# Patient Record
Sex: Female | Born: 2000 | Race: White | Hispanic: No | Marital: Single | Smoking: Former smoker
Health system: Southern US, Community
[De-identification: ages and names within clinical notes are randomized; demographics above are authoritative.]

## PROBLEM LIST (undated history)

## (undated) ENCOUNTER — Inpatient Hospital Stay (HOSPITAL_COMMUNITY): Payer: Self-pay

## (undated) DIAGNOSIS — G40909 Epilepsy, unspecified, not intractable, without status epilepticus: Secondary | ICD-10-CM

## (undated) DIAGNOSIS — N83209 Unspecified ovarian cyst, unspecified side: Secondary | ICD-10-CM

## (undated) DIAGNOSIS — R569 Unspecified convulsions: Secondary | ICD-10-CM

## (undated) DIAGNOSIS — J020 Streptococcal pharyngitis: Secondary | ICD-10-CM

## (undated) HISTORY — PX: WISDOM TOOTH EXTRACTION: SHX21

## (undated) HISTORY — DX: Epilepsy, unspecified, not intractable, without status epilepticus: G40.909

## (undated) HISTORY — DX: Unspecified convulsions: R56.9

---
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
## 2018-03-10 ENCOUNTER — Ambulatory Visit: Payer: Medicaid Other | Admitting: Neurology

## 2018-03-10 ENCOUNTER — Telehealth: Payer: Self-pay | Admitting: *Deleted

## 2018-03-10 NOTE — Telephone Encounter (Signed)
Pt no showed her f/u appt on 03/10/2018 @ 11:00 AM.

## 2018-03-11 ENCOUNTER — Encounter: Payer: Self-pay | Admitting: Neurology

## 2019-09-07 IMAGING — US US RENAL
1 series · 15 of 25 positions shown · non-contrast
Comparison: None.

CLINICAL DATA: 16-year-old female with sudden onset left flank pain
in the third trimester of pregnancy.

EXAM:
RENAL / URINARY TRACT ULTRASOUND COMPLETE

[Series 1: us renal · 15 of 38 slices shown]
[im 1/38]
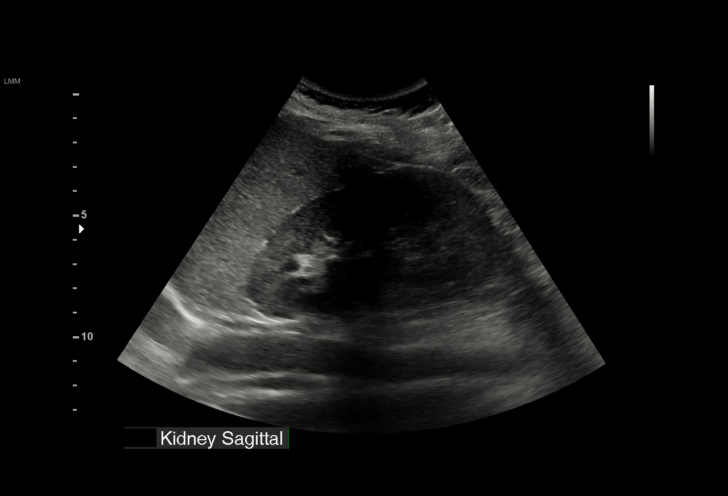
[im 4/38]
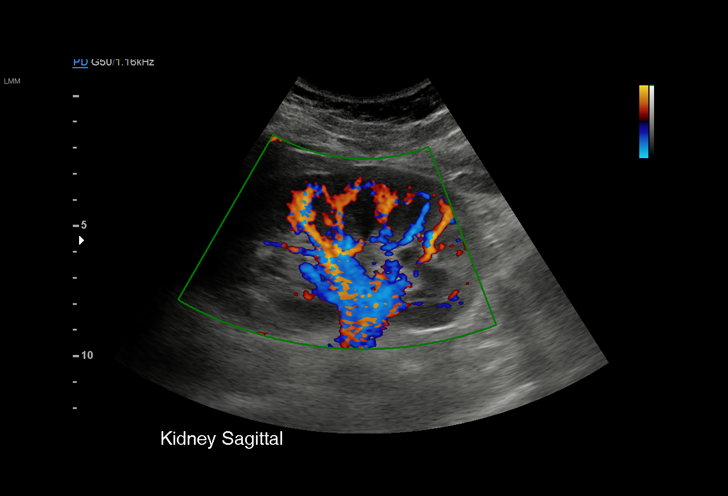
[im 7/38]
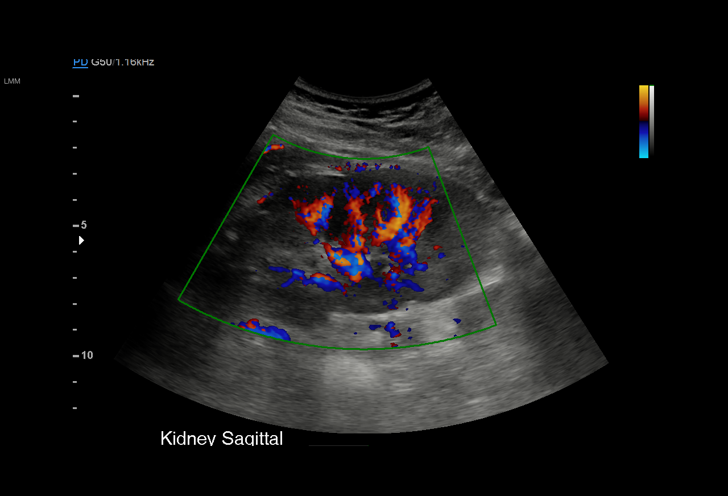
[im 8/38]
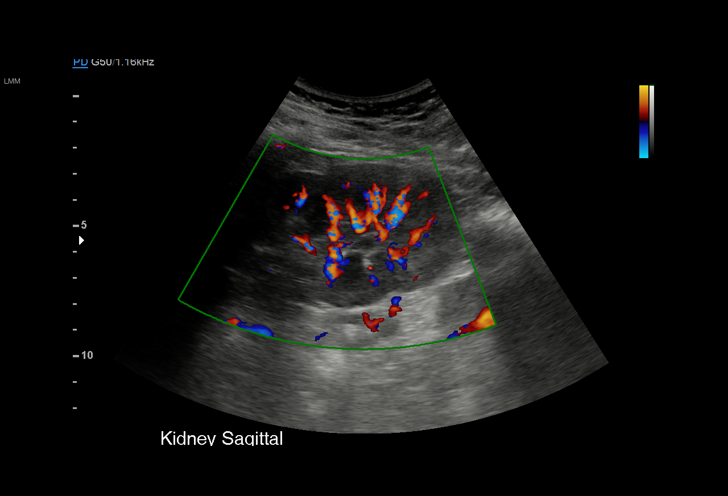
[im 11/38]
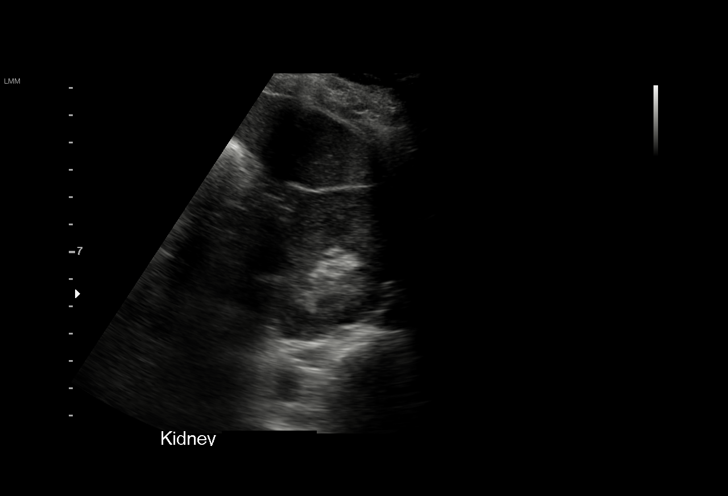
[im 14/38]
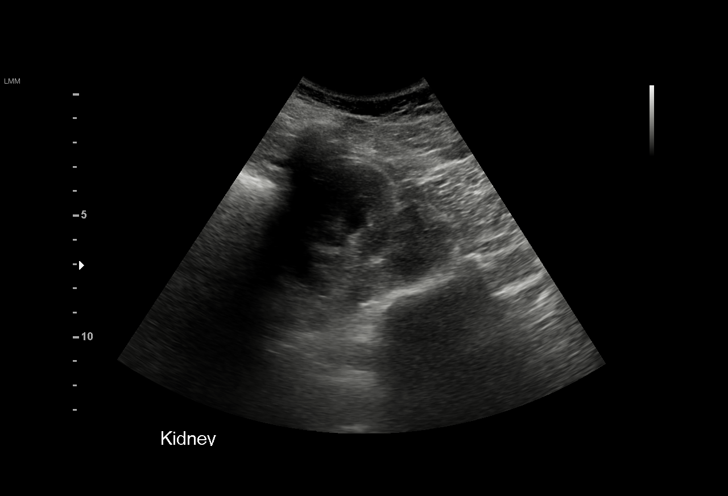
[im 16/38]
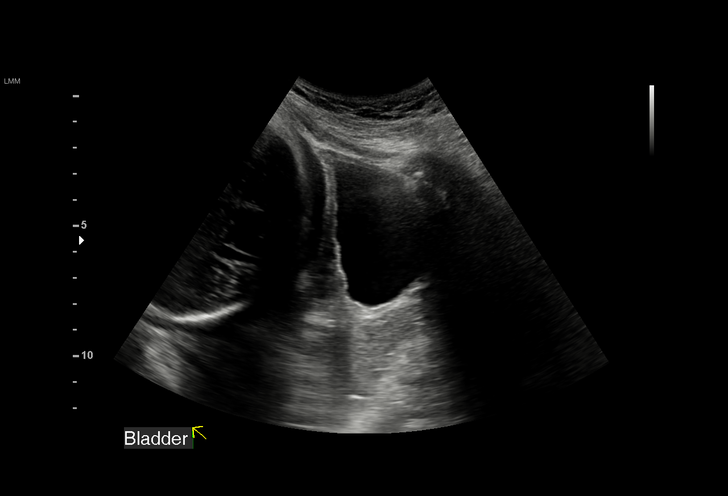
[im 19/38]
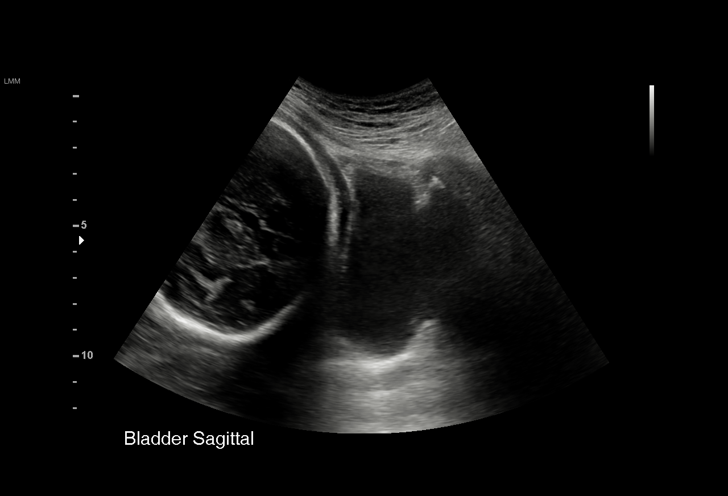
[im 22/38]
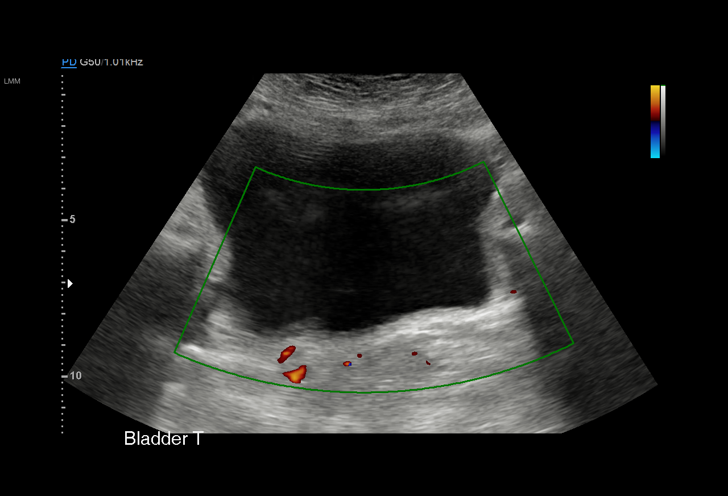
[im 24/38]
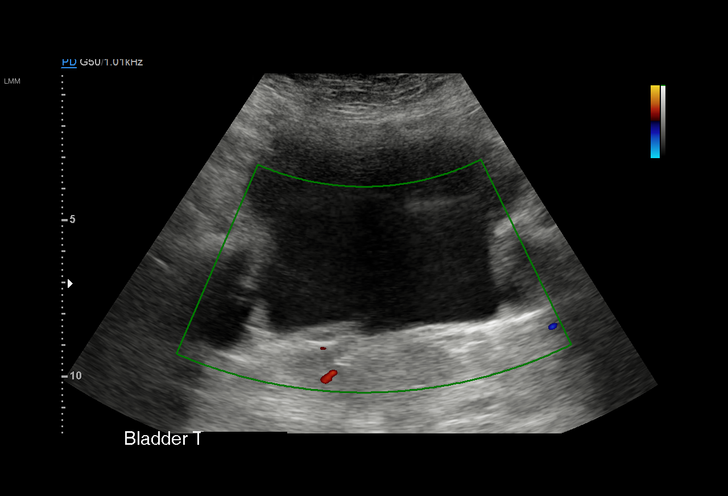
[im 27/38]
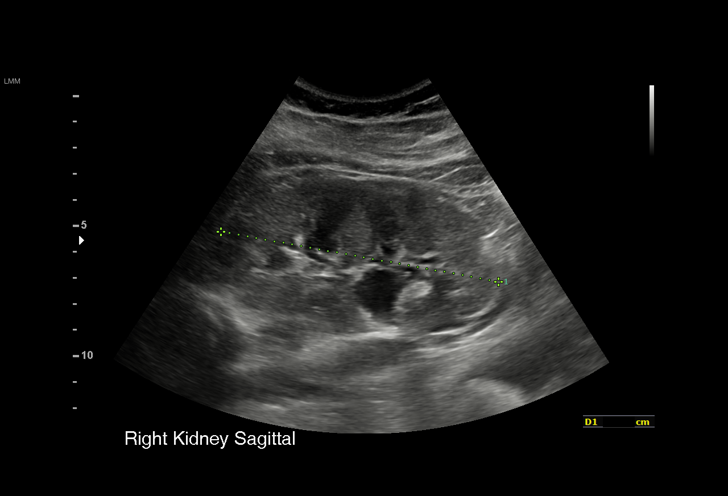
[im 30/38]
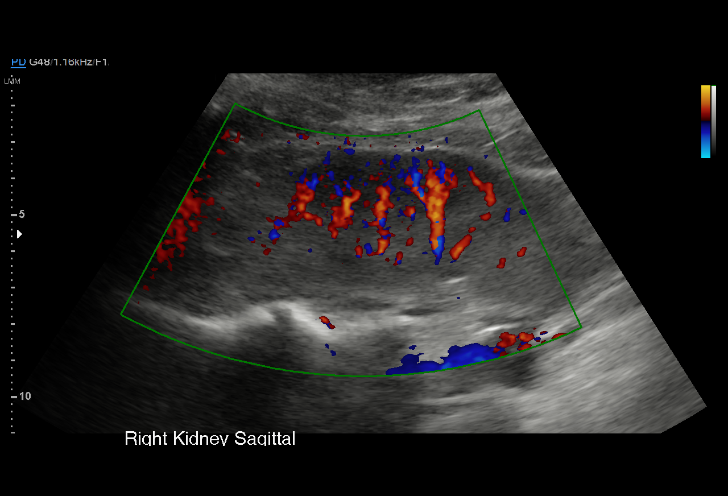
[im 31/38]
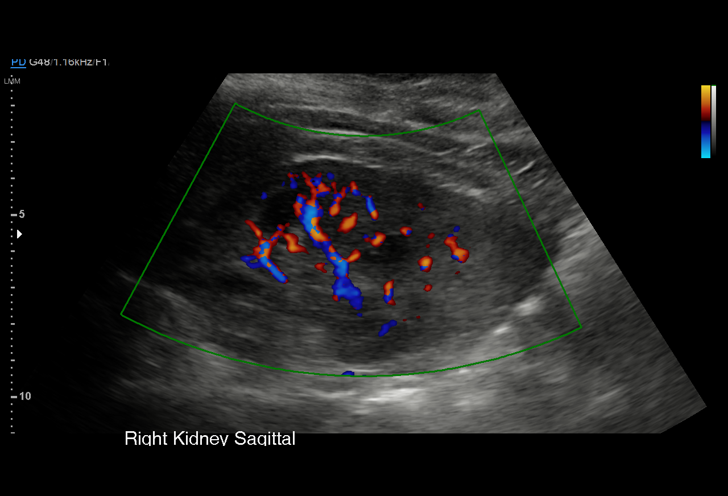
[im 34/38]
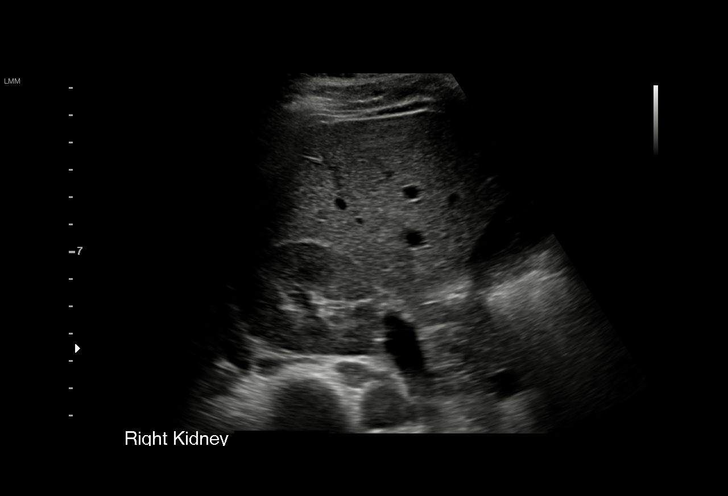
[im 38/38]
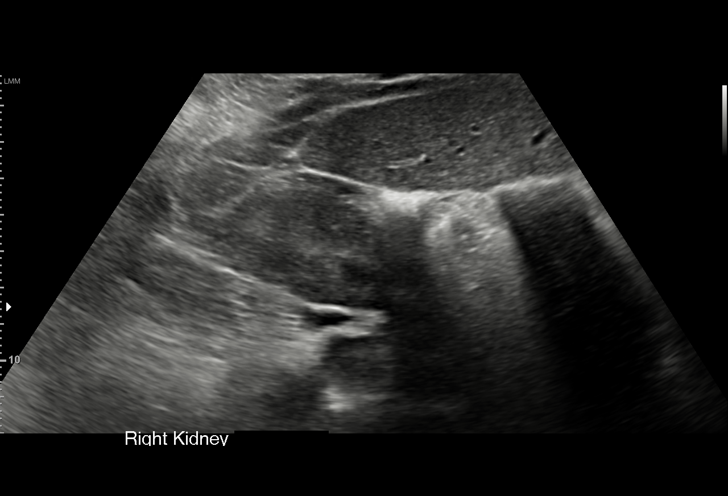

[15 of 25 positions shown; findings below may reference images not displayed]

FINDINGS: Right Kidney:

Length: 10.8 cm. Mild left hydronephrosis (image 29). Cortical
echogenicity is normal. No discrete right renal lesion.

Left Kidney:

Length: 10.8 cm. Echogenicity within normal limits. No mass or
hydronephrosis visualized.

Bladder:

Appears normal for degree of bladder distention.
IMPRESSION: 1. Normal ultrasound appearance of the left kidney in urinary
bladder.
2. Mild right side maternal hydronephrosis of pregnancy.

## 2019-09-12 IMAGING — US US MFM OB DETAIL+14 WK
1 series · 14 of 28 positions shown · non-contrast
Comparison: none

[Series 1: us mfm ob detail+14 wk · 67 acquisitions, 14 frames shown]
[im 3/67]
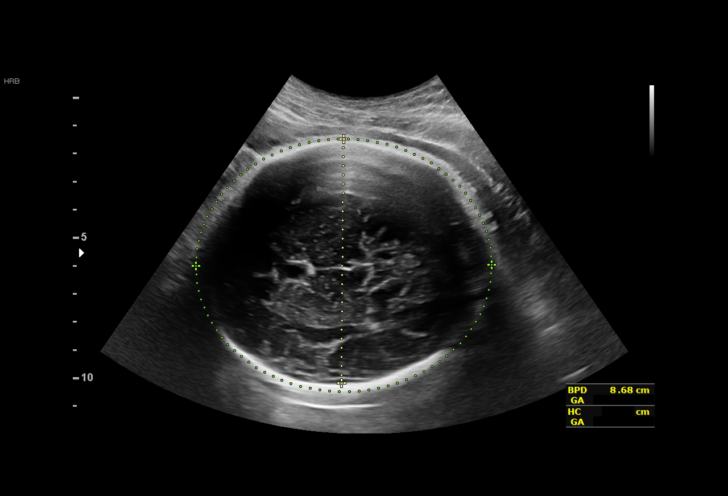
[im 8/67]
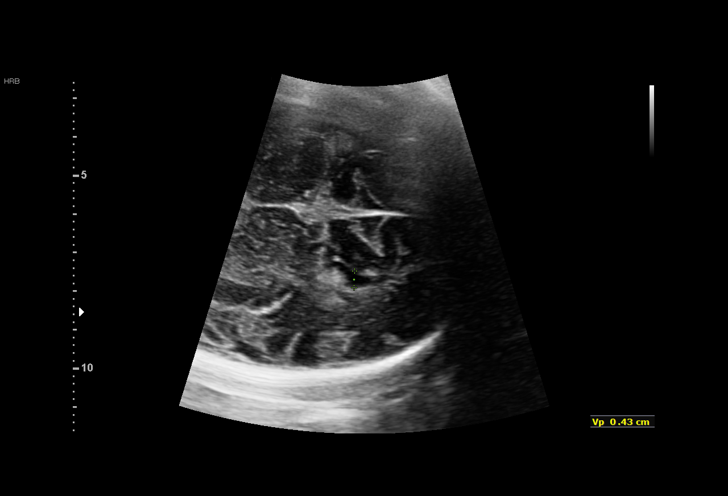
[im 13/67]
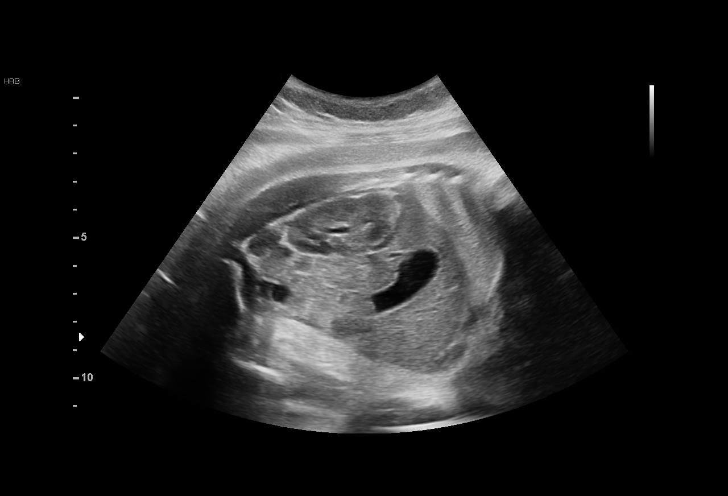
[im 18/67]
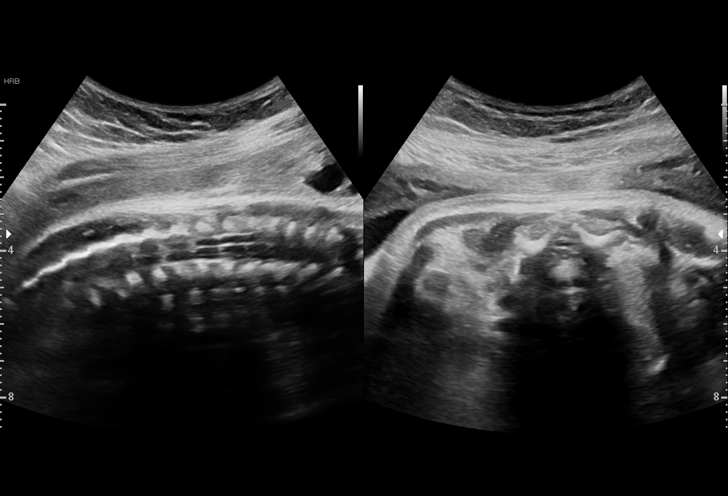
[im 23/67]
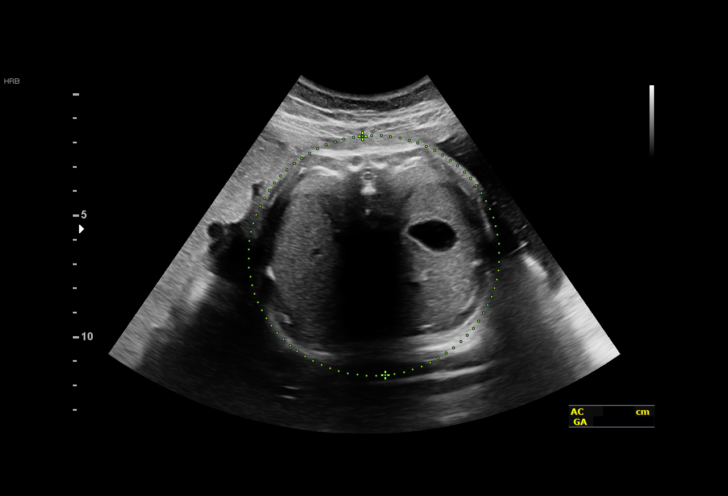
[im 27/67]
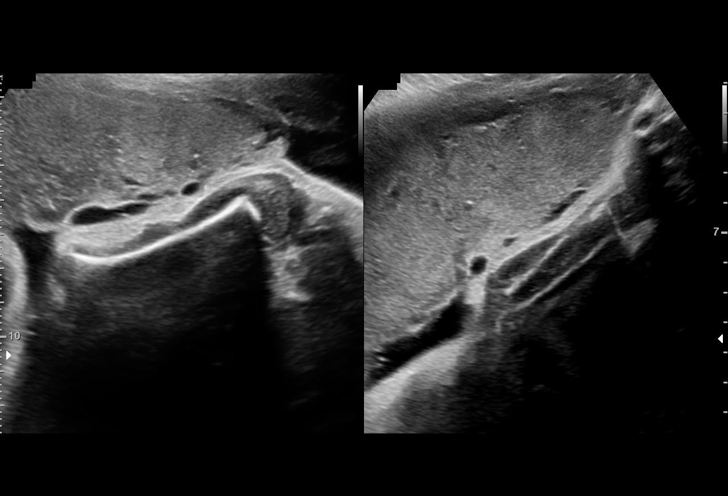
[im 32/67]
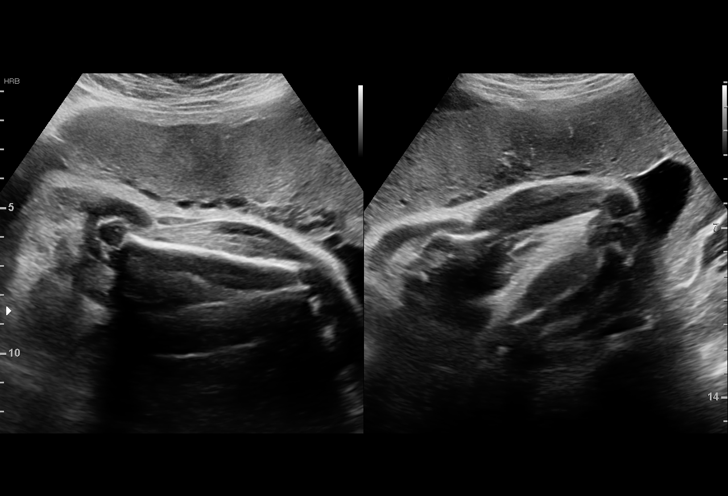
[im 37/67]
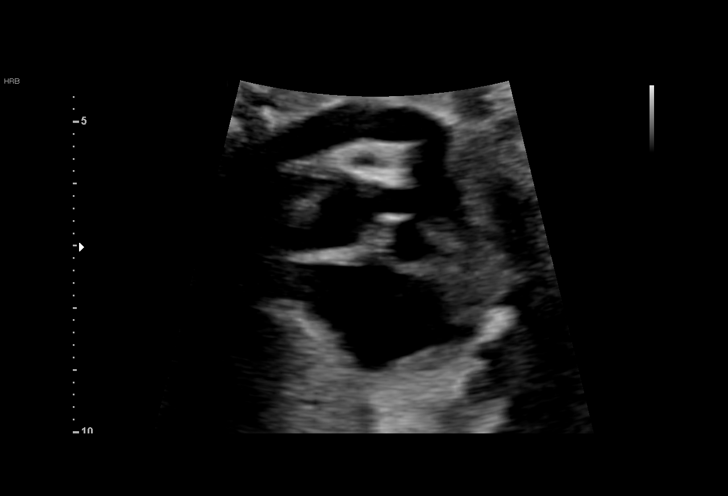
[im 42/67]
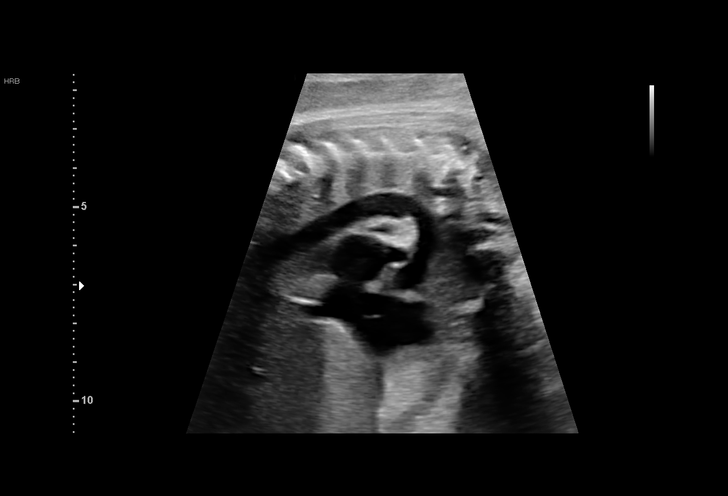
[im 47/67]
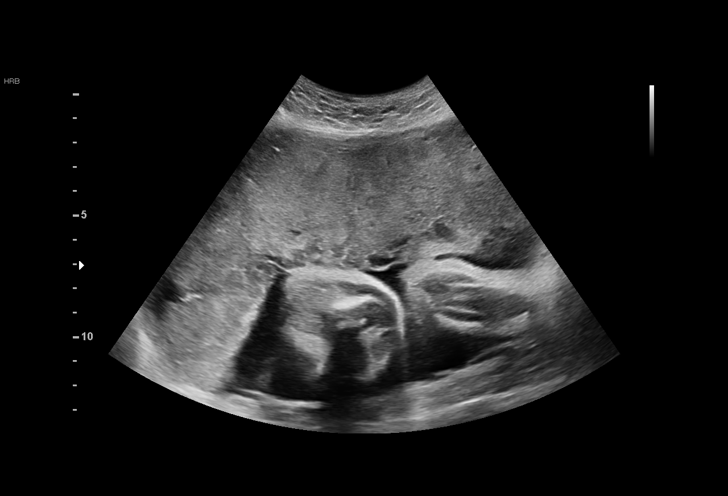
[im 52/67]
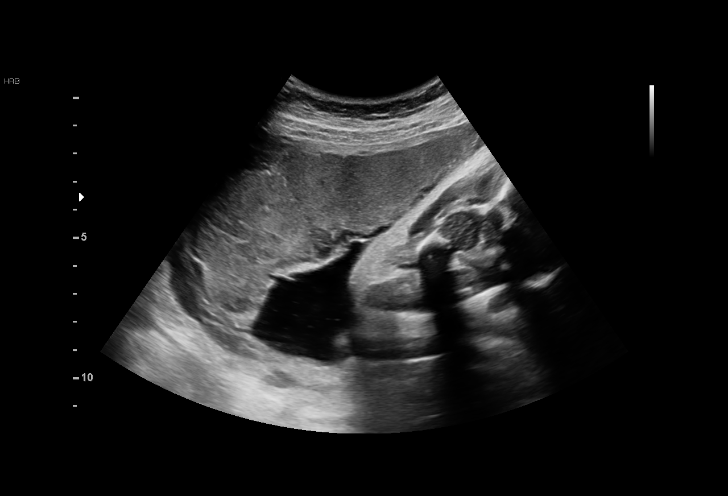
[im 57/67]
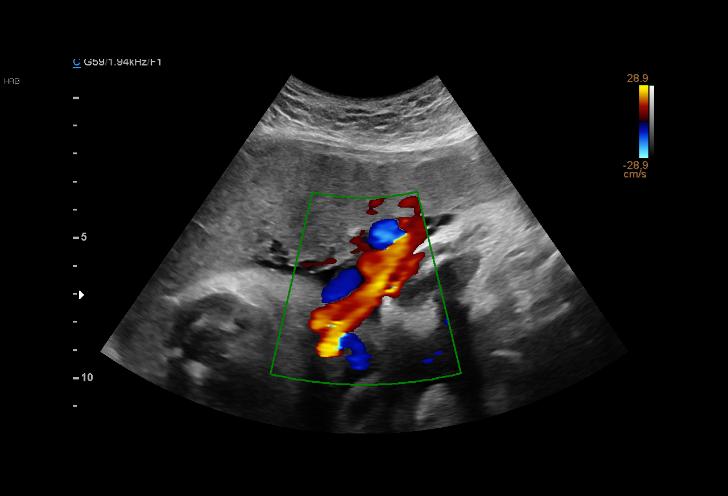
[im 62/67]
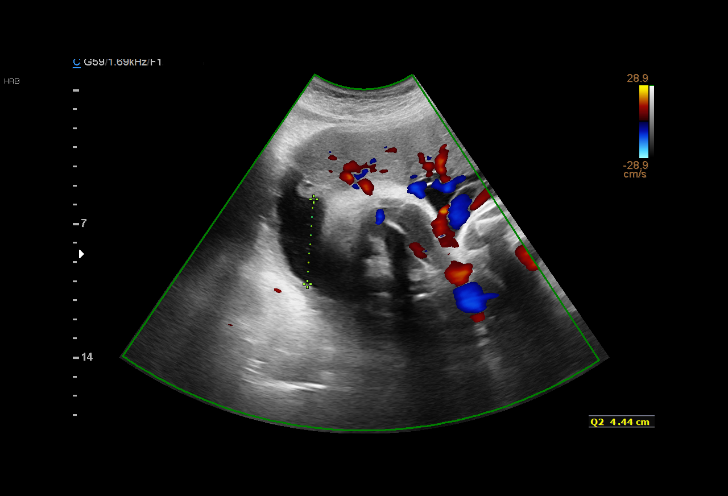
[im 67/67]
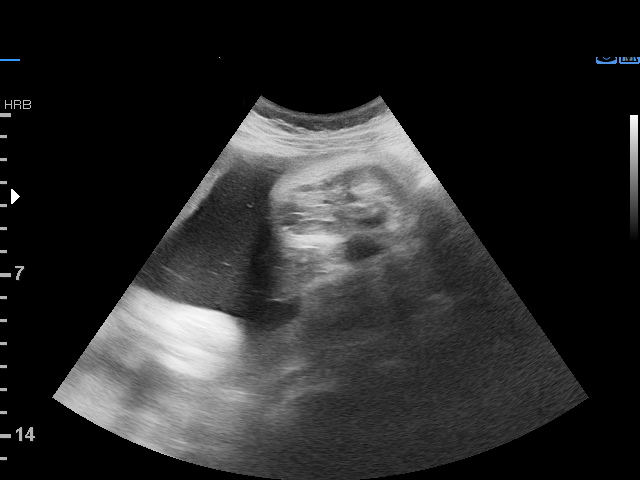

[14 of 28 positions shown; findings below may reference images not displayed]

pm)

Road [HOSPITAL]

Indications

34 weeks gestation of pregnancy
Encounter for fetal anatomic survey
Late to prenatal care, third trimester
Seizure disorder
OB History

Blood Type:            Height:  5'1"   Weight (lb):  157       BMI:
Gravidity:    1
Fetal Evaluation

Num Of Fetuses:     1
Fetal Heart         144
Rate(bpm):
Cardiac Activity:   Observed
Presentation:       Cephalic
Placenta:           Anterior, above cervical os
P. Cord Insertion:  Visualized, central

Amniotic Fluid
AFI FV:      Subjectively within normal limits

AFI Sum(cm)     %Tile       Largest Pocket(cm)
10.77           26

RUQ(cm)       RLQ(cm)       LUQ(cm)        LLQ(cm)
3.45          0
Biometry

BPD:      87.5  mm     G. Age:  35w 2d         69  %    CI:        78.18   %    70 - 86
FL/HC:      20.5   %    20.1 -
HC:      313.1  mm     G. Age:  35w 1d         24  %    HC/AC:      1.00        0.93 -
AC:      311.8  mm     G. Age:  35w 1d         67  %    FL/BPD:     73.5   %    71 - 87
FL:       64.3  mm     G. Age:  33w 2d         10  %    FL/AC:      20.6   %    20 - 24
HUM:      55.2  mm     G. Age:  32w 1d         10  %
CER:      50.7  mm     G. Age:  N/A          > 95  %

Est. FW:    4101  gm      5 lb 7 oz     59  %
Gestational Age

LMP:           34w 1d        Date:  05/30/16                 EDD:   03/06/17
U/S Today:     34w 5d                                        EDD:   03/02/17
Best:          34w 5d     Det. By:  Early Ultrasound         EDD:   03/02/17
(08/30/16)
Anatomy

Cranium:               Appears normal         Aortic Arch:            Appears normal
Cavum:                 Appears normal         Ductal Arch:            Appears normal
Ventricles:            Appears normal         Diaphragm:              Not well visualized
Choroid Plexus:        Appears normal         Stomach:                Appears normal, left
sided
Cerebellum:            Appears normal         Abdomen:                Appears normal
Posterior Fossa:       Appears normal         Abdominal Wall:         Not well visualized
Nuchal Fold:           Not applicable (>20    Cord Vessels:           Appears normal (3
wks GA)                                        vessel cord)
Face:                  Not well visualized    Kidneys:                Appear normal
Lips:                  Not well visualized    Bladder:                Appears normal
Thoracic:              Appears normal         Spine:                  Appears normal
Heart:                 Appears normal         Upper Extremities:      Visualized
(4CH, axis, and situs
RVOT:                  Appears normal         Lower Extremities:      Visualized
LVOT:                  Appears normal

Other:  Male genitalia were seen.  Technically difficult due to advanced GA
and fetal position.
Cervix Uterus Adnexa

Cervix
Not visualized (advanced GA >24wks)
Impression

SIUP at 78w4d, seizure disorder
active fetus
EFW 59th%'le
no dysmorphic features
limited views as documented
no previa
Recommendations

Interval growth and attempt to complete fetal survey in 4
weeks.
Delivery at 39 weeks given gestation complicated by seizure
disorder.

## 2020-11-02 ENCOUNTER — Other Ambulatory Visit: Payer: Self-pay

## 2020-11-02 ENCOUNTER — Emergency Department (HOSPITAL_COMMUNITY)
Admission: EM | Admit: 2020-11-02 | Discharge: 2020-11-02 | Disposition: A | Discharge status: Home / Self Care | Payer: Medicaid Other | Attending: Emergency Medicine | Admitting: Emergency Medicine

## 2020-11-02 ENCOUNTER — Encounter (HOSPITAL_COMMUNITY): Payer: Self-pay | Admitting: *Deleted

## 2020-11-02 DIAGNOSIS — M79674 Pain in right toe(s): Secondary | ICD-10-CM | POA: Diagnosis present

## 2020-11-02 DIAGNOSIS — Y92410 Unspecified street and highway as the place of occurrence of the external cause: Secondary | ICD-10-CM | POA: Insufficient documentation

## 2020-11-02 DIAGNOSIS — Z87891 Personal history of nicotine dependence: Secondary | ICD-10-CM | POA: Diagnosis not present

## 2020-11-02 DIAGNOSIS — Z79899 Other long term (current) drug therapy: Secondary | ICD-10-CM | POA: Diagnosis not present
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
## 2020-11-02 NOTE — Discharge Instructions (Addendum)
Your exam was reassuring, I suspect you will have some muscle tenderness within the next couple of days, I recommend over-the-counter pain medications.    You declined an x-ray of your right pinky toe it is possible you might have a small fracture in it, if you continue with pain please follow-up with your PCP in 2 days time for reevaluation.   Come back to the emergency department if you develop chest pain, shortness of breath, severe abdominal pain, uncontrolled nausea, vomiting, diarrhea.

## 2020-11-17 ENCOUNTER — Emergency Department (HOSPITAL_BASED_OUTPATIENT_CLINIC_OR_DEPARTMENT_OTHER)
Admission: EM | Admit: 2020-11-17 | Discharge: 2020-11-18 | Disposition: A | Discharge status: Home / Self Care | Payer: Medicaid Other | Attending: Emergency Medicine | Admitting: Emergency Medicine

## 2020-11-17 ENCOUNTER — Encounter (HOSPITAL_BASED_OUTPATIENT_CLINIC_OR_DEPARTMENT_OTHER): Payer: Self-pay | Admitting: *Deleted

## 2020-11-17 ENCOUNTER — Other Ambulatory Visit: Payer: Self-pay

## 2020-11-17 DIAGNOSIS — N12 Tubulo-interstitial nephritis, not specified as acute or chronic: Secondary | ICD-10-CM | POA: Insufficient documentation

## 2020-11-17 DIAGNOSIS — Z87891 Personal history of nicotine dependence: Secondary | ICD-10-CM | POA: Insufficient documentation

## 2020-11-17 DIAGNOSIS — Z79899 Other long term (current) drug therapy: Secondary | ICD-10-CM | POA: Insufficient documentation

## 2020-11-17 DIAGNOSIS — R109 Unspecified abdominal pain: Secondary | ICD-10-CM | POA: Diagnosis present

## 2020-11-17 NOTE — ED Triage Notes (Signed)
C/o right flank pain, fever , dysuria  and back pain , seen at [facility] this am  LWBS, labs and u/a done

## 2020-11-18 ENCOUNTER — Ambulatory Visit (HOSPITAL_BASED_OUTPATIENT_CLINIC_OR_DEPARTMENT_OTHER)
Admission: RE | Admit: 2020-11-18 | Discharge: 2020-11-18 | Disposition: A | Discharge status: Home / Self Care | Payer: Medicaid Other | Source: Ambulatory Visit | Attending: Emergency Medicine | Admitting: Emergency Medicine

## 2020-11-18 DIAGNOSIS — N3289 Other specified disorders of bladder: Secondary | ICD-10-CM | POA: Insufficient documentation

## 2020-11-18 DIAGNOSIS — R109 Unspecified abdominal pain: Secondary | ICD-10-CM | POA: Diagnosis present

## 2020-11-18 LAB — URINALYSIS, ROUTINE W REFLEX MICROSCOPIC
Bilirubin Urine: NEGATIVE
Glucose, UA: NEGATIVE mg/dL
Ketones, ur: NEGATIVE mg/dL
Nitrite: POSITIVE — AB
Protein, ur: NEGATIVE mg/dL
Specific Gravity, Urine: 1.015 (ref 1.005–1.030)
pH: 7 (ref 5.0–8.0)

## 2020-11-18 LAB — URINALYSIS, MICROSCOPIC (REFLEX)

## 2020-11-18 LAB — PREGNANCY, URINE: Preg Test, Ur: NEGATIVE

## 2020-11-18 MED ORDER — CEFPODOXIME PROXETIL 200 MG PO TABS: 200.0000 mg | ORAL_TABLET | Freq: Two times a day (BID) | ORAL | 0 refills | Status: DC

## 2020-11-18 MED ORDER — SODIUM CHLORIDE 0.9 % IV BOLUS
1000.0000 mL | Freq: Once | INTRAVENOUS | Status: AC
  Administered 2020-11-18: 1000 mL via INTRAVENOUS

## 2020-11-18 MED ORDER — ONDANSETRON HCL 4 MG/2ML IJ SOLN
4.0000 mg | Freq: Once | INTRAMUSCULAR | Status: AC
  Administered 2020-11-18: 4 mg via INTRAVENOUS
  Filled 2020-11-18: qty 2

## 2020-11-18 MED ORDER — KETOROLAC TROMETHAMINE 15 MG/ML IJ SOLN
15.0000 mg | Freq: Once | INTRAMUSCULAR | Status: AC
  Administered 2020-11-18: 15 mg via INTRAVENOUS
  Filled 2020-11-18: qty 1

## 2020-11-18 MED ORDER — ONDANSETRON 8 MG PO TBDP: 8.0000 mg | ORAL_TABLET | Freq: Three times a day (TID) | ORAL | 0 refills | Status: DC | PRN

## 2020-11-18 MED ORDER — SODIUM CHLORIDE 0.9 % IV SOLN
1.0000 g | Freq: Once | INTRAVENOUS | Status: AC
  Administered 2020-11-18: 1 g via INTRAVENOUS
  Filled 2020-11-18: qty 10

## 2020-11-18 NOTE — ED Provider Notes (Signed)
[facility] Provider Note: Lowella Dell, MD, FACEP  CSN: [id number] MRN: [id number] ARRIVAL: 11/17/20 at 2328 ROOM: MH09/MH09   CHIEF COMPLAINT  Flank Pain   HISTORY OF PRESENT ILLNESS  11/18/20 12:09 AM Allison Dawson is a 20 y.o. female who has had right flank pain for about a week.  This is been associated with general malaise, fever yesterday to 102.3, nausea and vomiting.  Symptoms of worsened over the last 2 days.  She rates the pain in her right flank as an 8 out of 10 and it radiates down her right side to her leg.  It is worse with movement or palpation.  She is not having burning with urination but states she has to force her urine out of her bladder and her urine is cloudy.  She has not had diarrhea, vaginal bleeding or vaginal discharge.  She checked into [facility] ED yesterday and had lab work done but left due to an 11-hour wait.  Review is that lab work shows a leukocytosis of 15 and a urinalysis consistent with a urinary tract infection.  Urine culture was apparently not done.   Past Medical History:  Diagnosis Date   Epilepsy (HCC)    Seizures (HCC)    Strep pharyngitis     Past Surgical History:  Procedure Laterality Date   CESAREAN SECTION N/A 02/28/2017   Procedure: CESAREAN SECTION;  Surgeon: Hermina Staggers, MD;  Location: [facility] BIRTHING SUITES;  Service: Obstetrics;  Laterality: N/A;   WISDOM TOOTH EXTRACTION      Family History  Problem Relation Age of Onset   Diabetes Maternal Grandfather    Diabetes Other     Social History   Tobacco Use   Smoking status: Former    Packs/day: 0.50    Types: Cigarettes    Quit date: 05/27/2016    Years since quitting: 4.4   Smokeless tobacco: Never  Vaping Use   Vaping Use: Never used  Substance Use Topics   Alcohol use: No   Drug use: No    Prior to Admission medications   Medication Sig Start Date End Date Taking? Authorizing Provider  cefpodoxime (VANTIN) 200 MG tablet Take 1 tablet (200 mg  total) by mouth 2 (two) times daily. 11/18/20  Yes Mende Biswell, Jonny Ruiz, MD  ondansetron (ZOFRAN ODT) 8 MG disintegrating tablet Take 1 tablet (8 mg total) by mouth every 8 (eight) hours as needed for nausea or vomiting. 11/18/20  Yes Aarvi Stotts, MD  acetaminophen (TYLENOL) 325 MG tablet Take 650 mg by mouth every 6 (six) hours as needed for moderate pain or headache.    [provider]  albuterol (PROVENTIL HFA;VENTOLIN HFA) 108 (90 Base) MCG/ACT inhaler Inhale 1 puff into the lungs every 6 (six) hours as needed for wheezing or shortness of breath.    [provider]  calcium carbonate (TUMS - DOSED IN MG ELEMENTAL CALCIUM) 500 MG chewable tablet Chew 2 tablets by mouth 3 (three) times daily as needed for indigestion or heartburn.    [provider]  levETIRAcetam (KEPPRA) 750 MG tablet Take 1 tablet (750 mg total) by mouth 2 (two) times daily. 11/11/17   Anson Fret, MD  Prenat-FeAsp-Meth-FA-DHA w/o A (PRENATE PIXIE) 10-0.6-0.4-200 MG CAPS Take 1 tablet by mouth daily. 01/18/17   Orvilla [place] A, CNM    Allergies Diflucan [fluconazole] and Phenazopyridine   REVIEW OF SYSTEMS  Negative except as noted here or in the History of Present Illness.   PHYSICAL EXAMINATION  Initial Vital Signs Blood pressure 128/83, pulse 100, temperature 99.4 F (37.4 C), temperature source Oral, resp. rate 18, height 5' (1.524 m), weight 54.4 kg, last menstrual period 11/14/2020, SpO2 100 %, unknown if currently breastfeeding.  Examination General: Well-developed, well-nourished female in no acute distress; appearance consistent with age of record HENT: normocephalic; atraumatic Eyes: pupils equal, round and reactive to light; extraocular muscles intact Neck: supple Heart: regular rate and rhythm Lungs: clear to auscultation bilaterally Abdomen: soft; nondistended; nontender; bowel sounds present GU: Right CVA tenderness Extremities: No deformity; full range of motion; pulses  normal Neurologic: Awake, alert and oriented; motor function intact in all extremities and symmetric; no facial droop Skin: Warm and dry Psychiatric: Normal mood and affect   RESULTS  Summary of this visit's results, reviewed and interpreted by myself:   EKG Interpretation  Date/Time:    Ventricular Rate:    PR Interval:    QRS Duration:   QT Interval:    QTC Calculation:   R Axis:     Text Interpretation:         Laboratory Studies: Results for orders placed or performed during the hospital encounter of 11/17/20 (from the past 24 hour(s))  Pregnancy, urine     Status: None   Collection Time: 11/17/20 11:56 PM  Result Value Ref Range   Preg Test, Ur NEGATIVE NEGATIVE  Urinalysis, Routine w reflex microscopic Urine, Clean Catch     Status: Abnormal   Collection Time: 11/17/20 11:56 PM  Result Value Ref Range   Color, Urine YELLOW YELLOW   APPearance CLOUDY (A) CLEAR   Specific Gravity, Urine 1.015 1.005 - 1.030   pH 7.0 5.0 - 8.0   Glucose, UA NEGATIVE NEGATIVE mg/dL   Hgb urine dipstick MODERATE (A) NEGATIVE   Bilirubin Urine NEGATIVE NEGATIVE   Ketones, ur NEGATIVE NEGATIVE mg/dL   Protein, ur NEGATIVE NEGATIVE mg/dL   Nitrite POSITIVE (A) NEGATIVE   Leukocytes,Ua MODERATE (A) NEGATIVE  Urinalysis, Microscopic (reflex)     Status: Abnormal   Collection Time: 11/17/20 11:56 PM  Result Value Ref Range   RBC / HPF 11-20 0 - 5 RBC/hpf   WBC, UA 21-50 0 - 5 WBC/hpf   Bacteria, UA MANY (A) NONE SEEN   Squamous Epithelial / LPF 6-10 0 - 5   Imaging Studies: No results found.  ED COURSE and MDM  Nursing notes, initial and subsequent vitals signs, including pulse oximetry, reviewed and interpreted by myself.  Vitals:   11/17/20 2338 11/17/20 2342 11/18/20 0050  BP: 128/83  114/72  Pulse: 100  82  Resp: 18  18  Temp: 99.4 F (37.4 C)    TempSrc: Oral    SpO2: 100%  98%  Weight:  54.4 kg   Height:  5' (1.524 m)    Medications  cefTRIAXone (ROCEPHIN) 1 g in  sodium chloride 0.9 % 100 mL IVPB (1 g Intravenous New Bag/Given 11/18/20 0037)  ondansetron (ZOFRAN) injection 4 mg (4 mg Intravenous Given 11/18/20 0033)  sodium chloride 0.9 % bolus 1,000 mL (1,000 mLs Intravenous New Bag/Given 11/18/20 0032)  ketorolac (TORADOL) 15 MG/ML injection 15 mg (15 mg Intravenous Given 11/18/20 0034)   Presentation consistent with acute right pyelonephritis.  She does not appear ill enough for admission to the hospital at this time.  She was given 1 g of Rocephin in the ED and urine was sent for culture.  We will treat as an outpatient.  Given the amount of flank tenderness and reports  of a fever I have a low suspicion of an obstructing stone at this time.  If pain persist despite treatment she should be reevaluated.  We will have her return later this morning for a renal ultrasound as a precaution.   PROCEDURES  Procedures   ED DIAGNOSES     ICD-10-CM   1. Pyelonephritis  N12          Wynonna Fitzhenry, MD 11/18/20 7412

## 2020-11-20 LAB — URINE CULTURE: Culture: >=100000 — AB

## 2020-11-21 ENCOUNTER — Telehealth: Payer: Self-pay

## 2020-11-21 NOTE — Telephone Encounter (Signed)
Post ED Visit - Positive Culture Follow-up  Culture report reviewed by antimicrobial stewardship pharmacist: [facility] [x]  , Pharm.D. []  Audie Clear, Pharm.D., BCPS AQ-ID []  , Pharm.D., BCPS []  Celedonio Miyamoto, Pharm.D., BCPS []  [place], Garvin Fila.D., BCPS, AAHIVP []  , Pharm.D., BCPS, AAHIVP []  Georgina Pillion, PharmD, BCPS []  , PharmD, BCPS []  [place], PharmD, BCPS []  [address], PharmD []  , PharmD, BCPS []  Estella Husk, PharmD  Pharmacy Team []  Lysle Pearl, PharmD []  , PharmD []  Phillips Climes, PharmD []  , Rph []  Agapito Games) , PharmD []  Verlan Friends, PharmD []  , PharmD []  Mervyn Gay, PharmD []  , PharmD []  Vinnie Level, PharmD []  [facility], PharmD []  , PharmD []  Len Childs, PharmD   Positive urine culture Treated with Cefpodoxime Proxetil, organism sensitive to the same and no further patient follow-up is required at this time.  11/21/2020, 11:42 AM

## 2021-02-20 ENCOUNTER — Other Ambulatory Visit: Payer: Medicaid Other

## 2022-01-09 ENCOUNTER — Emergency Department: Payer: Medicaid Other

## 2022-01-09 ENCOUNTER — Encounter: Payer: Self-pay | Admitting: Emergency Medicine

## 2022-01-09 ENCOUNTER — Emergency Department
Admission: EM | Admit: 2022-01-09 | Discharge: 2022-01-09 | Disposition: A | Discharge status: Home / Self Care | Payer: Medicaid Other | Attending: Emergency Medicine | Admitting: Emergency Medicine

## 2022-01-09 DIAGNOSIS — R1110 Vomiting, unspecified: Secondary | ICD-10-CM | POA: Diagnosis not present

## 2022-01-09 DIAGNOSIS — N80129 Deep endometriosis of ovary, unspecified ovary: Secondary | ICD-10-CM

## 2022-01-09 DIAGNOSIS — N83201 Unspecified ovarian cyst, right side: Secondary | ICD-10-CM

## 2022-01-09 DIAGNOSIS — N809 Endometriosis, unspecified: Secondary | ICD-10-CM

## 2022-01-09 DIAGNOSIS — R1032 Left lower quadrant pain: Secondary | ICD-10-CM | POA: Insufficient documentation

## 2022-01-09 HISTORY — DX: Unspecified ovarian cyst, unspecified side: N83.209

## 2022-01-09 LAB — COMPREHENSIVE METABOLIC PANEL
ALT: 12 U/L (ref 0–44)
AST: 15 U/L (ref 15–41)
Albumin: 4.3 g/dL (ref 3.5–5.0)
Alkaline Phosphatase: 68 U/L (ref 38–126)
Anion gap: 6 (ref 5–15)
BUN: 18 mg/dL (ref 6–20)
CO2: 21 mmol/L — ABNORMAL LOW (ref 22–32)
Calcium: 8.9 mg/dL (ref 8.9–10.3)
Chloride: 112 mmol/L — ABNORMAL HIGH (ref 98–111)
Creatinine, Ser: 0.66 mg/dL (ref 0.44–1.00)
GFR, Estimated: >60 mL/min (ref >60)
Glucose, Bld: 97 mg/dL (ref 70–99)
Potassium: 3.8 mmol/L (ref 3.5–5.1)
Sodium: 139 mmol/L (ref 135–145)
Total Bilirubin: 0.5 mg/dL (ref 0.3–1.2)
Total Protein: 8 g/dL (ref 6.5–8.1)

## 2022-01-09 LAB — URINALYSIS, ROUTINE W REFLEX MICROSCOPIC
Bilirubin Urine: NEGATIVE
Glucose, UA: NEGATIVE mg/dL
Ketones, ur: NEGATIVE mg/dL
Leukocytes,Ua: NEGATIVE
Nitrite: NEGATIVE
Protein, ur: NEGATIVE mg/dL
Specific Gravity, Urine: 1.009 (ref 1.005–1.030)
pH: 6 (ref 5.0–8.0)

## 2022-01-09 LAB — CBC WITH DIFFERENTIAL/PLATELET
Abs Immature Granulocytes: 0.05 10*3/uL (ref 0.00–0.07)
Basophils Absolute: 0 10*3/uL (ref 0.0–0.1)
Basophils Relative: 0 %
Eosinophils Absolute: 0.2 10*3/uL (ref 0.0–0.5)
Eosinophils Relative: 2 %
HCT: 36 % (ref 36.0–46.0)
Hemoglobin: 12.1 g/dL (ref 12.0–15.0)
Immature Granulocytes: 0 %
Lymphocytes Relative: 13 %
Lymphs Abs: 1.8 10*3/uL (ref 0.7–4.0)
MCH: 29 pg (ref 26.0–34.0)
MCHC: 33.6 g/dL (ref 30.0–36.0)
MCV: 86.3 fL (ref 80.0–100.0)
Monocytes Absolute: 1 10*3/uL (ref 0.1–1.0)
Monocytes Relative: 8 %
Neutro Abs: 10.2 10*3/uL — ABNORMAL HIGH (ref 1.7–7.7)
Neutrophils Relative %: 77 %
Platelets: 344 10*3/uL (ref 150–400)
RBC: 4.17 MIL/uL (ref 3.87–5.11)
RDW: 12.5 % (ref 11.5–15.5)
WBC: 13.3 10*3/uL — ABNORMAL HIGH (ref 4.0–10.5)
nRBC: 0 % (ref 0.0–0.2)

## 2022-01-09 LAB — HCG, QUANTITATIVE, PREGNANCY: hCG, Beta Chain, Quant, S: 1 m[IU]/mL (ref <5)

## 2022-01-09 MED ORDER — MORPHINE SULFATE (PF) 4 MG/ML IV SOLN
4.0000 mg | Freq: Once | INTRAVENOUS | Status: AC
  Administered 2022-01-09: 4 mg via INTRAVENOUS
  Filled 2022-01-09: qty 1

## 2022-01-09 MED ORDER — LACTATED RINGERS IV BOLUS
1000.0000 mL | Freq: Once | INTRAVENOUS | Status: AC
  Administered 2022-01-09: 1000 mL via INTRAVENOUS

## 2022-01-09 MED ORDER — ONDANSETRON HCL 4 MG/2ML IJ SOLN
4.0000 mg | Freq: Once | INTRAMUSCULAR | Status: AC
  Administered 2022-01-09: 4 mg via INTRAVENOUS
  Filled 2022-01-09: qty 2

## 2022-01-09 MED ORDER — OXYCODONE-ACETAMINOPHEN 5-325 MG PO TABS
1.0000 | ORAL_TABLET | Freq: Once | ORAL | Status: AC
  Administered 2022-01-09: 1 via ORAL
  Filled 2022-01-09: qty 1

## 2022-01-09 MED ORDER — OXYCODONE HCL 5 MG PO TABS: 5.0000 mg | ORAL_TABLET | Freq: Three times a day (TID) | ORAL | 0 refills | Status: AC | PRN

## 2022-01-09 NOTE — ED Provider Notes (Addendum)
Patient's care assumed from Dr. Starleen Blue, see their note for initial HPI.  Patient has a history of endometrioma and ovarian cyst.  Last saw gynecology yesterday with plans for follow-up in a week.  Lower abdominal and pelvic pain.  Patient received IV morphine for pain control.  Lab work with mild leukocytosis.  Currently waiting for a UA.  Pregnancy test is negative.  Ultrasound appears similar to prior ultrasounds with ovarian cyst and questionable endometrioma.  On reevaluation patient did have some improvement but ongoing pain.  Waiting for UA.  Given a Percocet for pain control.  No signs of ovarian torsion and pain has not been intermittent so have a low suspicion for torsion detorsion.  Patient states that she wants to follow-up with her gynecologist in [place] and has an appointment scheduled for next week.  We will plan for pain control and given with return precautions.    UA without signs of urinary tract infection.  Significantly improved pain control.  Patient will start on her OCP that was prescribed by her gynecologist yesterday.  We will add on ibuprofen, acetaminophen and Percocet.  Discussed risk and benefits of Percocet use.  Discharged home in stable condition        Nathaniel Man, MD 01/09/22 1540    Nathaniel Man, MD 01/09/22 [phone]

## 2022-01-09 NOTE — ED Triage Notes (Signed)
Pt presents via POV with complaints of left lower abdominal pain that started a few hours ago. Hx of ovarian cyst - seen by OBGYN yesterday with no pain - supposed to get the cyst removed 02/19/22. Pt took ibuprofen PTA with no improvement to her sx. Denies CP or SOB.

## 2022-01-09 NOTE — Discharge Instructions (Addendum)
You were seen in the emergency department and had an ultrasound done that showed findings concerning for an ovarian cyst and possibly an endometrioma which can be seen with endometriosis.  It is important that you start the birth control pills that your gynecologist started.  You can add on ibuprofen 800 mg every 6 hours for pain control.  If you are still in pain you can take Tylenol (acetaminophen) or Percocet.  Make sure to start a stool softener and MiraLAX if you have constipation with narcotic pain medication use.

## 2022-01-09 NOTE — ED Notes (Signed)
Pt given warm pack

## 2022-01-09 NOTE — ED Provider Notes (Signed)
[facility] Provider Note    Event Date/Time   First MD Initiated Contact with Patient 01/09/22 [phone]     (approximate)   History   Abdominal Pain   HPI  Allison Dawson is a 21 y.o. female past medical history of seizure disorder and ovarian cyst who presents with left lower quadrant pain.  Pain started this morning woke her up from sleep.  Severe constant sharp stabbing pain that is worse in the left lower quadrant.  She had an episode of emesis.  She is currently on her menstrual period.  Denies urinary symptoms denies abnormal vaginal discharge.   Patient had a endovaginal ultrasound in April 2023 which showed bilateral ovarian lesions which were thought to be hemorrhagic cyst versus endometriomas.    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   Generalized idiopathic epilepsy and epileptic syndromes, not intractable, without status epilepticus ([place]) 03/05/2017   Status post primary low transverse cesarean section 03/04/2017   Encounter for induction of labor 02/27/2017   Epilepsy affecting pregnancy ([place]) 01/24/2017   Maternal varicella, non-immune 01/23/2017   Supervision of high risk pregnancy, antepartum, third trimester 01/18/2017   Late prenatal care affecting pregnancy in third trimester 01/18/2017     Physical Exam  Triage Vital Signs: ED Triage Vitals  Enc Vitals Group     BP 01/09/22 0445 107/78     Pulse Rate 01/09/22 0445 93     Resp 01/09/22 0445 20     Temp 01/09/22 0445 97.9 F (36.6 C)     Temp Source 01/09/22 0445 Oral     SpO2 01/09/22 0445 100 %     Weight 01/09/22 0444 123 lb 7.3 oz (56 kg)     Height 01/09/22 0444 5' (1.524 m)     Head Circumference --      Peak Flow --      Pain Score 01/09/22 0447 10     Pain Loc --      Pain Edu? --      Excl.  in [place]? --     Most recent vital signs: Vitals:   01/09/22 0445  BP: 107/78  Pulse: 93  Resp: 20  Temp: 97.9 F (36.6 C)  SpO2: 100%     General: Awake, patient is in significant distress sitting up bent over crying CV:  Good peripheral perfusion.  Resp:  Normal effort.  Abd:  No distention.  Patient is tender throughout worse in the left lower quadrant with voluntary guarding Neuro:             Awake, Alert, Oriented x 3  Other:     ED Results / Procedures / Treatments  Labs (all labs ordered are listed, but only abnormal results are displayed) Labs Reviewed  COMPREHENSIVE METABOLIC PANEL - Abnormal; Notable for the following components:      Result Value   Chloride 112 (*)    CO2 21 (*)    All other components within normal limits  CBC WITH DIFFERENTIAL/PLATELET - Abnormal; Notable for the following components:   WBC 13.3 (*)    Neutro Abs 10.2 (*)    All other components within normal limits  HCG, QUANTITATIVE, PREGNANCY  URINALYSIS, ROUTINE W REFLEX MICROSCOPIC  POC URINE PREG, ED  EKG    RADIOLOGY Pelvic ultrasound pending   PROCEDURES:  Critical Care performed: No  .1-3 Lead EKG Interpretation  Performed by: Georga Hacking, MD Authorized by: Georga Hacking, MD     Interpretation: abnormal     ECG rate assessment: tachycardic     Rhythm: sinus tachycardia     Ectopy: none     Conduction: normal     The patient is on the cardiac monitor to evaluate for evidence of arrhythmia and/or significant heart rate changes.   MEDICATIONS ORDERED IN ED: Medications  lactated ringers bolus 1,000 mL (1,000 mLs Intravenous New Bag/Given 01/09/22 0524)  morphine (PF) 4 MG/ML injection 4 mg (4 mg Intravenous Given 01/09/22 0520)  ondansetron (ZOFRAN) injection 4 mg (4 mg Intravenous Given 01/09/22 0519)     IMPRESSION / MDM / ASSESSMENT AND PLAN / ED COURSE  I reviewed the triage vital signs and the nursing notes.                               Patient's presentation is most consistent with acute presentation with potential threat to life or bodily function.  Differential diagnosis includes, but is not limited to, ruptured ovarian cyst, ovarian torsion, perforated viscus, appendicitis, diverticulitis, endometriosis  Patient is a 21 year old female with known bilateral ovarian cyst presents with acute onset of lower abdominal pain worse on the left.  Woke her up from sleep and she has had 1 episode of emesis.  Patient is in quite a bit of distress on my evaluation she is crying sitting up in bed bending forward she will barely allow me to examine her.  She is tender in both lower quadrants worse on the left with some voluntary guarding.  Given her history ovarian cyst and concern for ovarian torsion.  We will treat her pain with IV morphine and nausea with Zofran.  We will give fluid bolus.  We will send CBC CMP pregnancy test.  Will obtain pelvic ultrasound.    Patient's labs are notable for leukocytosis of 13 CMP is reassuring hCG quantitative is negative.  Patient is still in ultrasound.  I have signed out to oncoming provider disposition pending reassessment and ultrasound read.   FINAL CLINICAL IMPRESSION(S) / ED DIAGNOSES   Final diagnoses:  Abdominal pain, left lower quadrant     Rx / DC Orders   ED Discharge Orders     None        Note:  This document was prepared using Dragon voice recognition software and may include unintentional dictation errors.   Georga Hacking, MD 01/09/22 [phone]

## 2022-11-22 ENCOUNTER — Emergency Department
Admission: EM | Admit: 2022-11-22 | Discharge: 2022-11-22 | Disposition: A | Discharge status: Home / Self Care | Payer: Medicaid Other

## 2022-11-22 NOTE — ED Triage Notes (Signed)
First Nurse Note;  [place] [facility] [place] Pt c/o heart palpitations, on EMS arrival pt was lethargic. EMS reports started on Flueoxitin and Propanolol for anxiety that started 2 days ago. Pt c/o R sided burning sensation on the RUQ. Pt is A&Ox4 and NAD. VSS

## 2022-11-22 NOTE — ED Notes (Signed)
Called x1

## 2023-07-07 IMAGING — US US RENAL
1 series · 14 of 25 positions shown · non-contrast
Comparison: Ultrasound 01/20/2017

CLINICAL DATA: Right-sided flank pain

EXAM:
RENAL / URINARY TRACT ULTRASOUND COMPLETE

[Series 1: us renal · 14 of 30 slices shown]
[im 1/30]
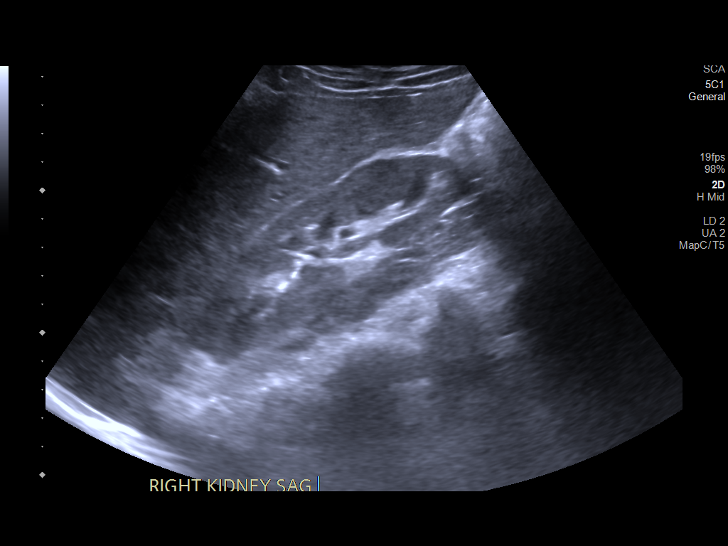
[im 3/30]
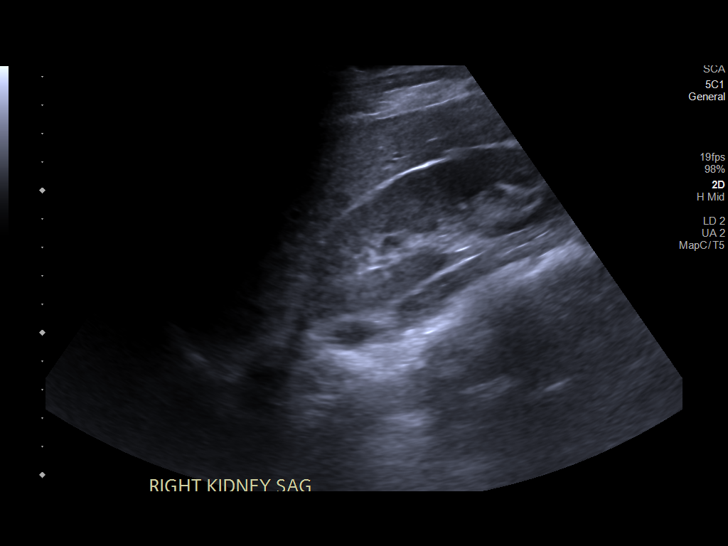
[im 5/30]
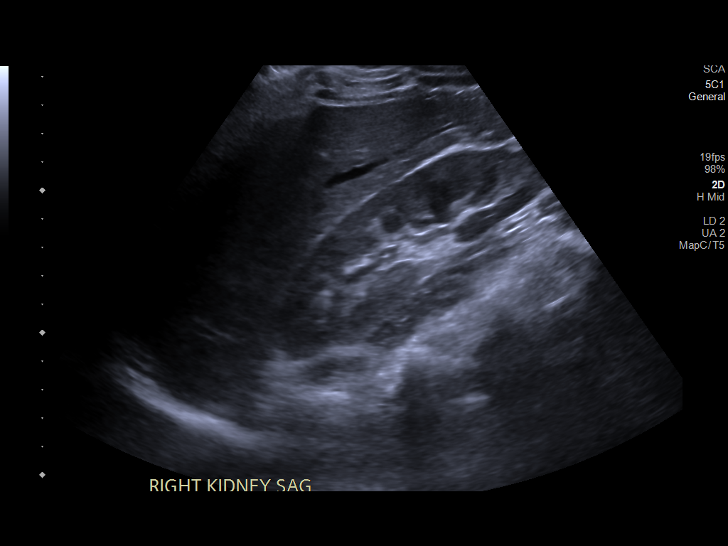
[im 8/30]
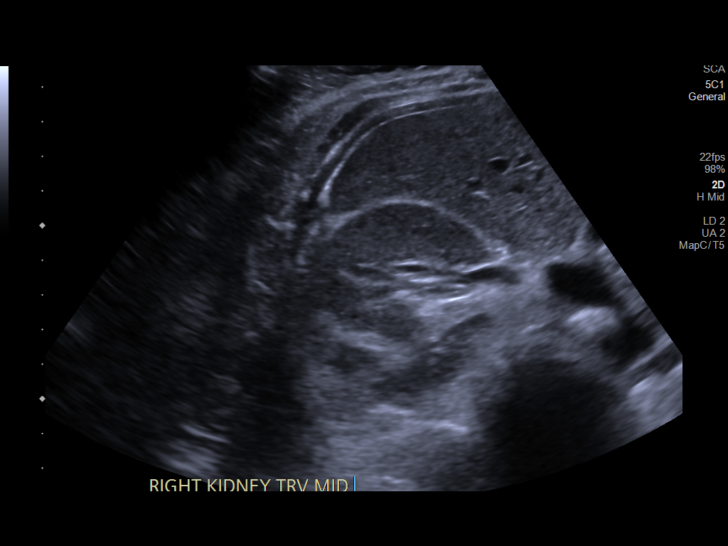
[im 10/30]
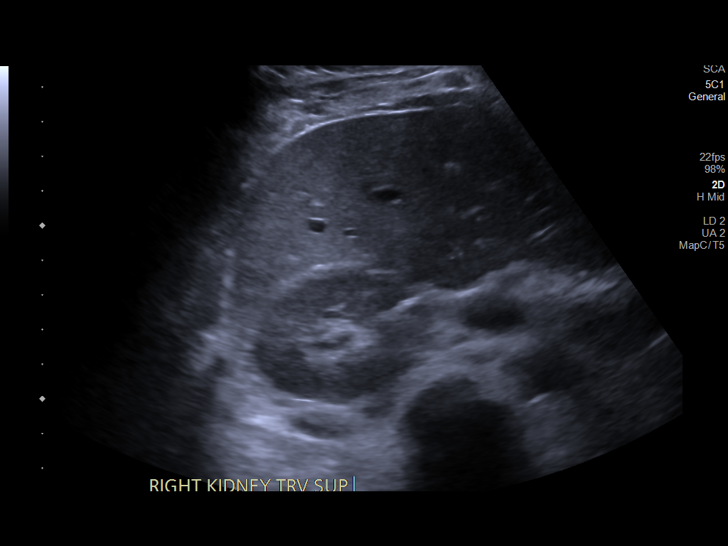
[im 11/30]
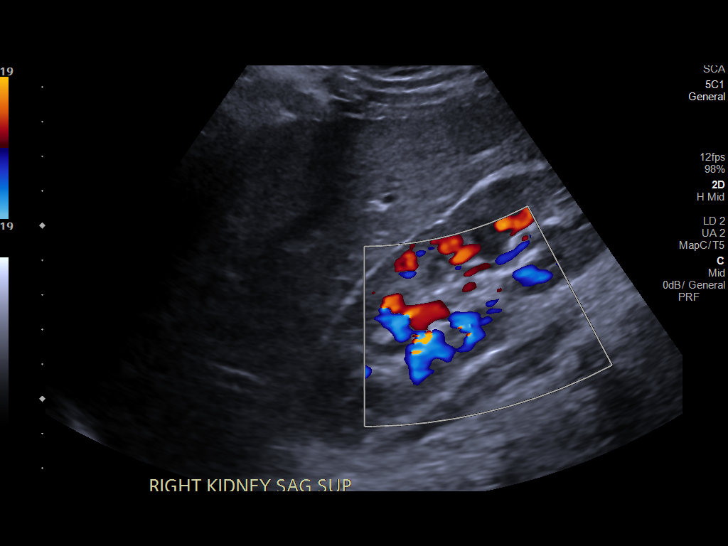
[im 14/30]
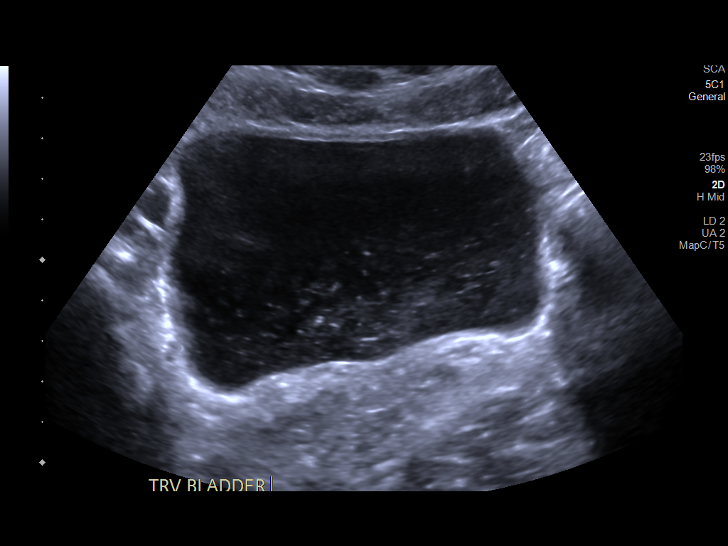
[im 16/30]
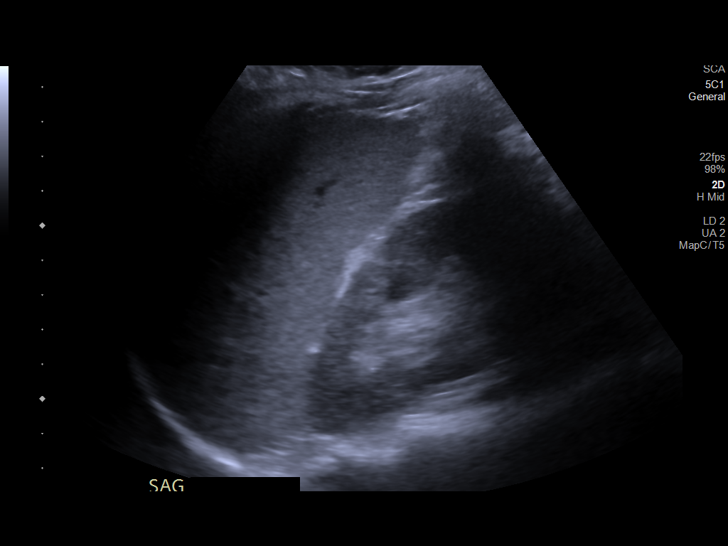
[im 19/30]
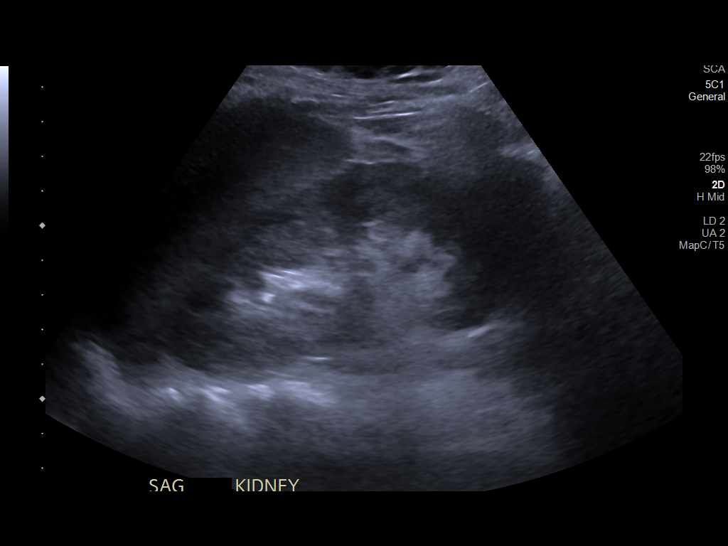
[im 20/30]
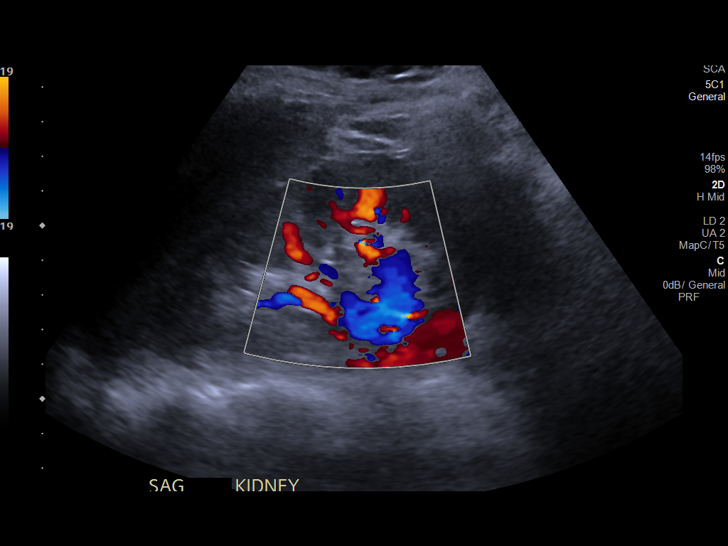
[im 22/30]
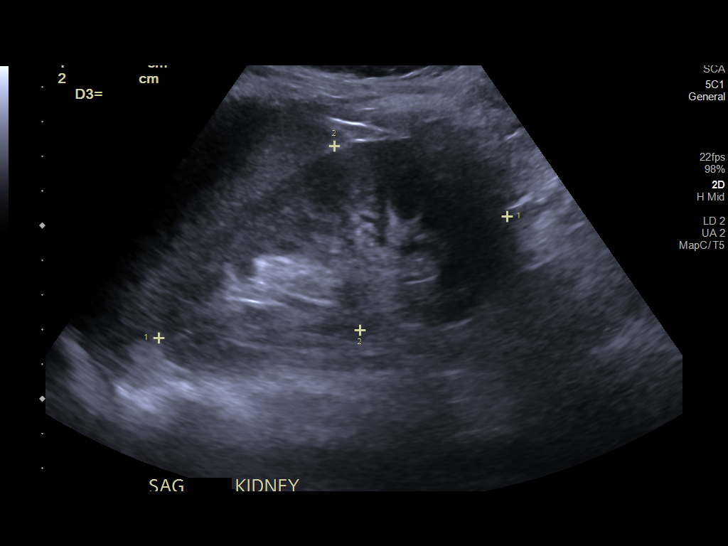
[im 25/30]
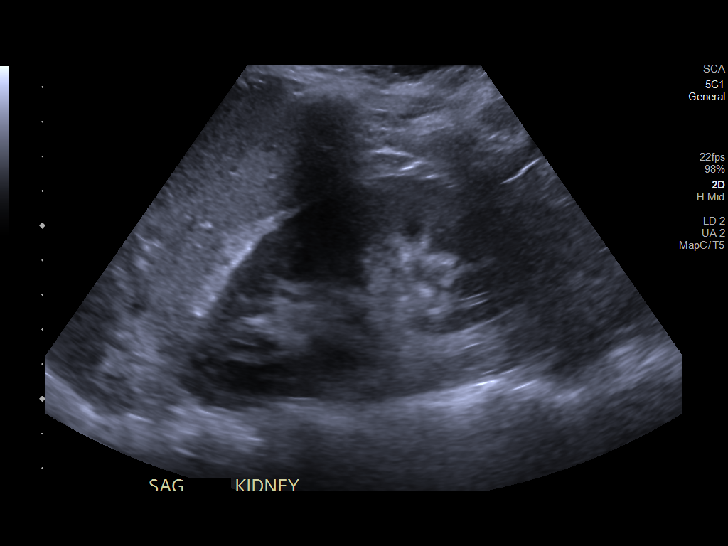
[im 27/30]
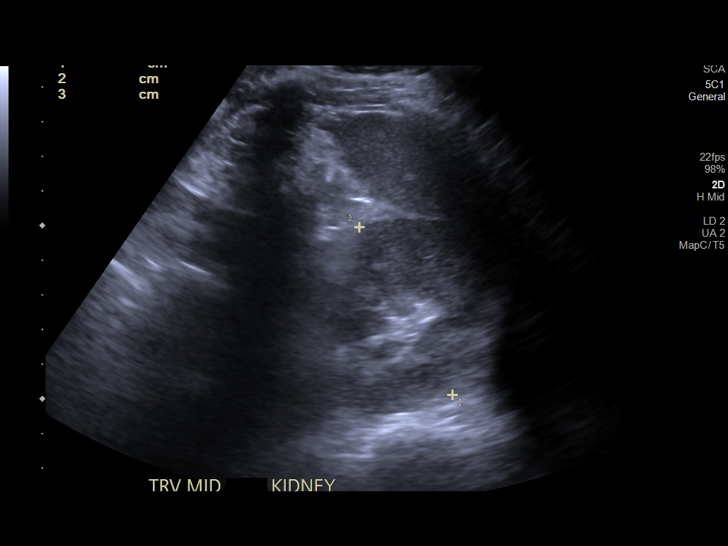
[im 30/30]
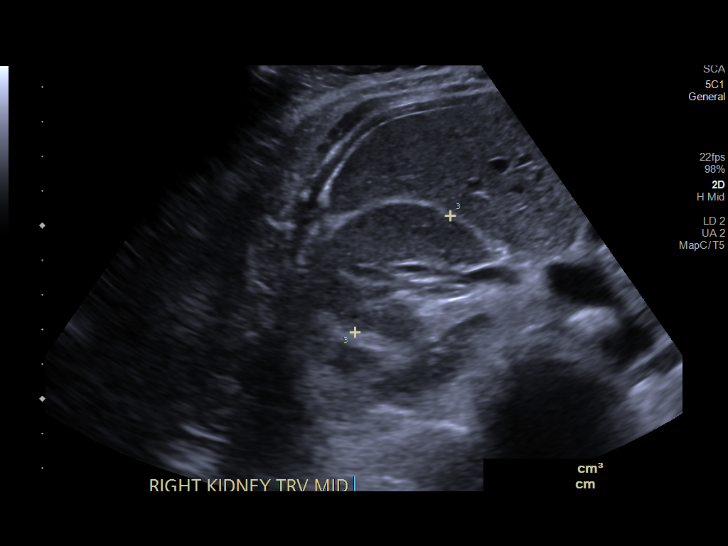

[14 of 25 positions shown; findings below may reference images not displayed]

FINDINGS: Right Kidney:

Renal measurements: 10.7 x 3.6 x 4.4 cm = volume: 86.9 mL.
Echogenicity within normal limits. No mass or hydronephrosis
visualized.

Left Kidney:

Renal measurements: 10.7 x 5.4 x 5.5 cm = volume: 165.9 mL.
Echogenicity within normal limits. No mass or hydronephrosis
visualized.

Bladder:

Debris within the urinary bladder

Other:

None.
IMPRESSION: 1. Negative ultrasound appearance of the kidneys.
2. Debris in the urinary bladder

## 2023-08-09 ENCOUNTER — Other Ambulatory Visit: Payer: Self-pay

## 2023-08-09 ENCOUNTER — Emergency Department

## 2023-08-09 ENCOUNTER — Emergency Department
Admission: EM | Admit: 2023-08-09 | Discharge: 2023-08-09 | Disposition: A | Discharge status: Home / Self Care | Attending: Emergency Medicine | Admitting: Emergency Medicine

## 2023-08-09 DIAGNOSIS — S99922A Unspecified injury of left foot, initial encounter: Secondary | ICD-10-CM | POA: Diagnosis present

## 2023-08-09 DIAGNOSIS — W010XXA Fall on same level from slipping, tripping and stumbling without subsequent striking against object, initial encounter: Secondary | ICD-10-CM | POA: Insufficient documentation

## 2023-08-09 DIAGNOSIS — S91312A Laceration without foreign body, left foot, initial encounter: Secondary | ICD-10-CM | POA: Insufficient documentation

## 2023-08-09 MED ORDER — LIDOCAINE-EPINEPHRINE-TETRACAINE (LET) TOPICAL GEL
3.0000 mL | Freq: Once | TOPICAL | Status: AC
  Administered 2023-08-09: 3 mL via TOPICAL
  Filled 2023-08-09 (×2): qty 3

## 2023-08-09 MED ORDER — CEPHALEXIN 500 MG PO CAPS
500.0000 mg | ORAL_CAPSULE | Freq: Two times a day (BID) | ORAL | 0 refills | Status: AC
Start: 2023-08-09 — End: 2023-08-16

## 2023-08-09 MED ORDER — KETOROLAC TROMETHAMINE 15 MG/ML IJ SOLN
15.0000 mg | Freq: Once | INTRAMUSCULAR | Status: AC
  Administered 2023-08-09: 15 mg via INTRAMUSCULAR
  Filled 2023-08-09: qty 1

## 2023-08-09 NOTE — ED Triage Notes (Signed)
 Pt comes with left foot injury. Pt states she fell last night and cut it on piece of brick.

## 2023-08-09 NOTE — ED Provider Notes (Signed)
 [facility] Emergency Department Provider Note     Event Date/Time   First MD Initiated Contact with Patient 08/09/23 1104     (approximate)   History   Foot Injury   HPI  Allison Dawson is a 23 y.o. female in the ED for evaluation of a left foot injury that occurred last night.  Patient reports she slipped on a piece of brick sustaining a small cut to the bottom of her foot.  Pain is worse with weightbearing. Tetanus up to date per patient.  Sensation and motor function intact.  No other complaint.     Physical Exam   Triage Vital Signs: ED Triage Vitals  Encounter Vitals Group     BP 08/09/23 1024 109/77     Systolic BP Percentile --      Diastolic BP Percentile --      Pulse Rate 08/09/23 1024 99     Resp 08/09/23 1024 18     Temp 08/09/23 1024 98 F (36.7 C)     Temp src --      SpO2 08/09/23 1024 100 %     Weight 08/09/23 1118 123 lb 7.3 oz (56 kg)     Height 08/09/23 1118 5' (1.524 m)     Head Circumference --      Peak Flow --      Pain Score 08/09/23 1023 10     Pain Loc --      Pain Education --      Exclude from Growth Chart --     Most recent vital signs: Vitals:   08/09/23 1024  BP: 109/77  Pulse: 99  Resp: 18  Temp: 98 F (36.7 C)  SpO2: 100%    General Awake, no distress.  HEENT NCAT. PERRL. EOMI.  CV:  Good peripheral perfusion.  RESP:  Normal effort.  ABD:  No distention.  Other:  Approximately 3 cm superficial laceration to aspect of left foot.  Tender to palpation.  Neurovascular status intact all throughout.  Full ROM of all digits.   ED Results / Procedures / Treatments   Labs (all labs ordered are listed, but only abnormal results are displayed) Labs Reviewed - No data to display  RADIOLOGY  I personally viewed and evaluated these images as part of my medical decision making, as well as reviewing the written report by the radiologist.  ED Provider Interpretation: No foreign body or acute bony  abnormality.  DG Foot Complete Left Result Date: 08/09/2023 CLINICAL DATA:  pain.  Left foot injury.  Fall. EXAM: LEFT FOOT - COMPLETE 3+ VIEW COMPARISON:  None Available. FINDINGS: No acute fracture or dislocation. No aggressive osseous lesion. No significant arthritis of imaged joints. No focal soft tissue swelling. No radiopaque foreign bodies. IMPRESSION: No acute osseous abnormality of the left foot. Electronically Signed   By: Beula Brunswick M.D.   On: 08/09/2023 10:48    PROCEDURES:  Critical Care performed: No  .Laceration Repair  Date/Time: 08/09/2023 1:45 PM  Performed by: Billye Buerger, PA-C Authorized by: Billye Buerger, PA-C   Consent:    Consent obtained:  Verbal   Consent given by:  Patient   Risks discussed:  Infection, pain and poor wound healing Anesthesia:    Anesthesia method:  Topical application   Topical anesthetic:  LET Laceration details:    Location:  Foot   Foot location:  Sole of L foot   Length (cm):  3   Depth (mm):  0.5 Exploration:    Imaging obtained: x-ray     Imaging outcome: foreign body not noted   Treatment:    Area cleansed with:  Chlorhexidine and saline   Amount of cleaning:  Extensive   Irrigation solution:  Sterile saline   Irrigation method:  Pressure wash Skin repair:    Repair method:  Steri-Strips   Number of Steri-Strips:  1 (dermaclip) Approximation:    Approximation:  Loose Repair type:    Repair type:  Simple Post-procedure details:    Dressing:  Non-adherent dressing   Procedure completion:  Tolerated   MEDICATIONS ORDERED IN ED: Medications  ketorolac  (TORADOL ) 15 MG/ML injection 15 mg (15 mg Intramuscular Given 08/09/23 1148)  lidocaine -EPINEPHrine -tetracaine  (LET) topical gel (3 mLs Topical Given by Other 08/09/23 1218)   IMPRESSION / MDM / ASSESSMENT AND PLAN / ED COURSE  I reviewed the triage vital signs and the nursing notes.                               23 y.o. female presents to the emergency  department for evaluation and treatment of left foot injury stating laceration to dorsal aspect x yesterday. See HPI for further details.   Differential diagnosis includes, but is not limited to foreign body, laceration, contusion, fracture  Patient's presentation is most consistent with acute complicated illness / injury requiring diagnostic workup.  Patient is alert and oriented.  She is hemodynamically stable.  Physical exam findings are as stated above and overall benign.  Foot x-ray is reassuring.  Please see procedure note.  Area extensively cleaned with high-pressure sterile saline.  1 derma clip placed.  Dressing applied with gauze and Coban.  Provided crutches.  RICE education provided.  Patient is in stable condition for discharge home and outpatient management. ED return precautions discussed thoroughly.  Will start on short course antibiotics outpatient.  FINAL CLINICAL IMPRESSION(S) / ED DIAGNOSES   Final diagnoses:  Injury of left foot, initial encounter  Laceration of left foot, initial encounter   Rx / DC Orders   ED Discharge Orders          Ordered    cephALEXin  (KEFLEX ) 500 MG capsule  2 times daily        08/09/23 1257           Note:  This document was prepared using Dragon voice recognition software and may include unintentional dictation errors.    Alvaro Augusta A, PA-C 08/09/23 1349    Jacquie Maudlin, MD 08/09/23 [phone]

## 2023-08-09 NOTE — Discharge Instructions (Addendum)
 You were evaluated in the ED for left foot injury.  Your x-ray is normal.  Please review nonsutured laceration care attached to your discharge papers.  Keep the area dressed with gauze, Band-Aid etc. elevate the foot as much as possible at home.  Keep area clean.  Do not submerge in water.  Take antibiotics as instructed.  Follow-up with your primary care doctor for wound check.

## 2023-12-07 ENCOUNTER — Emergency Department

## 2023-12-07 ENCOUNTER — Other Ambulatory Visit: Payer: Self-pay

## 2023-12-07 ENCOUNTER — Emergency Department: Admitting: Certified Registered Nurse Anesthetist

## 2023-12-07 ENCOUNTER — Ambulatory Visit
Admission: EM | Admit: 2023-12-07 | Discharge: 2023-12-07 | Disposition: A | Discharge status: Home / Self Care | Attending: Emergency Medicine | Admitting: Emergency Medicine

## 2023-12-07 ENCOUNTER — Encounter
Admission: EM | Disposition: A | Discharge status: Home / Self Care | Payer: Self-pay | Source: Home / Self Care | Attending: Emergency Medicine

## 2023-12-07 ENCOUNTER — Encounter: Payer: Self-pay | Admitting: Emergency Medicine

## 2023-12-07 DIAGNOSIS — K660 Peritoneal adhesions (postprocedural) (postinfection): Secondary | ICD-10-CM | POA: Insufficient documentation

## 2023-12-07 DIAGNOSIS — K6289 Other specified diseases of anus and rectum: Secondary | ICD-10-CM | POA: Insufficient documentation

## 2023-12-07 DIAGNOSIS — N736 Female pelvic peritoneal adhesions (postinfective): Secondary | ICD-10-CM | POA: Diagnosis not present

## 2023-12-07 DIAGNOSIS — N8302 Follicular cyst of left ovary: Secondary | ICD-10-CM | POA: Insufficient documentation

## 2023-12-07 DIAGNOSIS — N80123 Deep endometriosis of bilateral ovaries: Secondary | ICD-10-CM

## 2023-12-07 DIAGNOSIS — K5900 Constipation, unspecified: Secondary | ICD-10-CM | POA: Diagnosis present

## 2023-12-07 DIAGNOSIS — N83201 Unspecified ovarian cyst, right side: Secondary | ICD-10-CM | POA: Diagnosis not present

## 2023-12-07 DIAGNOSIS — R1900 Intra-abdominal and pelvic swelling, mass and lump, unspecified site: Secondary | ICD-10-CM

## 2023-12-07 DIAGNOSIS — G40909 Epilepsy, unspecified, not intractable, without status epilepticus: Secondary | ICD-10-CM | POA: Diagnosis not present

## 2023-12-07 DIAGNOSIS — N80122 Deep endometriosis of left ovary: Secondary | ICD-10-CM | POA: Insufficient documentation

## 2023-12-07 HISTORY — PX: LAPAROSCOPIC OVARIAN CYSTECTOMY: SHX6248

## 2023-12-07 LAB — TYPE AND SCREEN
ABO/RH(D): A POS
Antibody Screen: POSITIVE

## 2023-12-07 LAB — CBC WITH DIFFERENTIAL/PLATELET
Abs Immature Granulocytes: 0.03 K/uL (ref 0.00–0.07)
Basophils Absolute: 0.1 K/uL (ref 0.0–0.1)
Basophils Relative: 1 %
Eosinophils Absolute: 0 K/uL (ref 0.0–0.5)
Eosinophils Relative: 0 %
HCT: 34.1 % — ABNORMAL LOW (ref 36.0–46.0)
Hemoglobin: 10.9 g/dL — ABNORMAL LOW (ref 12.0–15.0)
Immature Granulocytes: 0 %
Lymphocytes Relative: 54 %
Lymphs Abs: 5.8 K/uL — ABNORMAL HIGH (ref 0.7–4.0)
MCH: 26.9 pg (ref 26.0–34.0)
MCHC: 32 g/dL (ref 30.0–36.0)
MCV: 84.2 fL (ref 80.0–100.0)
Monocytes Absolute: 0.8 K/uL (ref 0.1–1.0)
Monocytes Relative: 7 %
Neutro Abs: 4.1 K/uL (ref 1.7–7.7)
Neutrophils Relative %: 38 %
Platelets: 276 K/uL (ref 150–400)
RBC: 4.05 MIL/uL (ref 3.87–5.11)
RDW: 15.1 % (ref 11.5–15.5)
Smear Review: NORMAL
WBC: 10.8 K/uL — ABNORMAL HIGH (ref 4.0–10.5)
nRBC: 0 % (ref 0.0–0.2)

## 2023-12-07 LAB — URINALYSIS, W/ REFLEX TO CULTURE (INFECTION SUSPECTED)
Bilirubin Urine: NEGATIVE
Glucose, UA: NEGATIVE mg/dL
Hgb urine dipstick: NEGATIVE
Ketones, ur: NEGATIVE mg/dL
Nitrite: NEGATIVE
Protein, ur: 30 mg/dL — AB
Specific Gravity, Urine: 1.016 (ref 1.005–1.030)
WBC, UA: >50 WBC/hpf (ref 0–5)
pH: 9 — ABNORMAL HIGH (ref 5.0–8.0)

## 2023-12-07 LAB — COMPREHENSIVE METABOLIC PANEL WITH GFR
ALT: 158 U/L — ABNORMAL HIGH (ref 0–44)
AST: 173 U/L — ABNORMAL HIGH (ref 15–41)
Albumin: 3.6 g/dL (ref 3.5–5.0)
Alkaline Phosphatase: 81 U/L (ref 38–126)
Anion gap: 8 (ref 5–15)
BUN: 11 mg/dL (ref 6–20)
CO2: 22 mmol/L (ref 22–32)
Calcium: 8.6 mg/dL — ABNORMAL LOW (ref 8.9–10.3)
Chloride: 108 mmol/L (ref 98–111)
Creatinine, Ser: 0.73 mg/dL (ref 0.44–1.00)
GFR, Estimated: >60 mL/min (ref >60)
Glucose, Bld: 100 mg/dL — ABNORMAL HIGH (ref 70–99)
Potassium: 3.9 mmol/L (ref 3.5–5.1)
Sodium: 138 mmol/L (ref 135–145)
Total Bilirubin: 0.6 mg/dL (ref 0.0–1.2)
Total Protein: 7.2 g/dL (ref 6.5–8.1)

## 2023-12-07 LAB — POC URINE PREG, ED: Preg Test, Ur: NEGATIVE

## 2023-12-07 SURGERY — EXCISION, CYST, OVARY, LAPAROSCOPIC
Anesthesia: General | Laterality: Bilateral

## 2023-12-07 MED ORDER — DEXAMETHASONE SODIUM PHOSPHATE 10 MG/ML IJ SOLN
INTRAMUSCULAR | Status: DC | PRN
  Administered 2023-12-07: 10 mg via INTRAVENOUS

## 2023-12-07 MED ORDER — HYDROMORPHONE HCL 1 MG/ML IJ SOLN
INTRAMUSCULAR | Status: AC
  Filled 2023-12-07: qty 1

## 2023-12-07 MED ORDER — DEXAMETHASONE SODIUM PHOSPHATE 10 MG/ML IJ SOLN
INTRAMUSCULAR | Status: AC
  Filled 2023-12-07: qty 1

## 2023-12-07 MED ORDER — FUROSEMIDE 10 MG/ML IJ SOLN
INTRAMUSCULAR | Status: DC | PRN
  Administered 2023-12-07 (×2): 5 mg via INTRAMUSCULAR

## 2023-12-07 MED ORDER — FUROSEMIDE 10 MG/ML IJ SOLN
INTRAMUSCULAR | Status: AC
  Filled 2023-12-07: qty 4

## 2023-12-07 MED ORDER — ACETAMINOPHEN 500 MG PO TABS
1000.0000 mg | ORAL_TABLET | ORAL | Status: AC
Start: 2023-12-08 — End: 2023-12-07
  Administered 2023-12-07: 1000 mg via ORAL

## 2023-12-07 MED ORDER — FLEET ENEMA RE ENEM
1.0000 | ENEMA | Freq: Once | RECTAL | Status: AC
  Administered 2023-12-07: 1 via RECTAL

## 2023-12-07 MED ORDER — MIDAZOLAM HCL 2 MG/2ML IJ SOLN
INTRAMUSCULAR | Status: AC
  Filled 2023-12-07: qty 2

## 2023-12-07 MED ORDER — SILVER NITRATE-POT NITRATE 75-25 % EX MISC
CUTANEOUS | Status: DC | PRN
Start: 2023-12-07 — End: 2023-12-07
  Administered 2023-12-07: 2

## 2023-12-07 MED ORDER — ROCURONIUM BROMIDE 100 MG/10ML IV SOLN
INTRAVENOUS | Status: DC | PRN
  Administered 2023-12-07 (×2): 10 mg via INTRAVENOUS
  Administered 2023-12-07: 50 mg via INTRAVENOUS

## 2023-12-07 MED ORDER — ONDANSETRON HCL 4 MG/2ML IJ SOLN
INTRAMUSCULAR | Status: AC
Start: 2023-12-07 — End: 2023-12-07
  Filled 2023-12-07: qty 2

## 2023-12-07 MED ORDER — ONDANSETRON HCL 4 MG/2ML IJ SOLN
INTRAMUSCULAR | Status: DC | PRN
  Administered 2023-12-07: 4 mg via INTRAVENOUS

## 2023-12-07 MED ORDER — DEXMEDETOMIDINE HCL IN NACL 80 MCG/20ML IV SOLN
INTRAVENOUS | Status: AC
  Filled 2023-12-07: qty 20

## 2023-12-07 MED ORDER — MORPHINE SULFATE (PF) 4 MG/ML IV SOLN
4.0000 mg | Freq: Once | INTRAVENOUS | Status: AC
  Administered 2023-12-07: 4 mg via INTRAVENOUS
  Filled 2023-12-07: qty 1

## 2023-12-07 MED ORDER — FLUORESCEIN SODIUM 10 % IV SOLN
INTRAVENOUS | Status: AC
  Filled 2023-12-07: qty 5

## 2023-12-07 MED ORDER — ONDANSETRON HCL 4 MG/2ML IJ SOLN
INTRAMUSCULAR | Status: AC
  Filled 2023-12-07: qty 2

## 2023-12-07 MED ORDER — GABAPENTIN 300 MG PO CAPS
300.0000 mg | ORAL_CAPSULE | ORAL | Status: AC
  Administered 2023-12-07: 300 mg via ORAL

## 2023-12-07 MED ORDER — FENTANYL CITRATE (PF) 100 MCG/2ML IJ SOLN
INTRAMUSCULAR | Status: AC
  Filled 2023-12-07: qty 2

## 2023-12-07 MED ORDER — ONDANSETRON HCL 4 MG/2ML IJ SOLN: 4.0000 mg | Freq: Once | INTRAMUSCULAR | Status: DC

## 2023-12-07 MED ORDER — OXYCODONE HCL 5 MG PO TABS
5.0000 mg | ORAL_TABLET | Freq: Once | ORAL | Status: AC | PRN
  Administered 2023-12-07: 5 mg via ORAL

## 2023-12-07 MED ORDER — ACETAMINOPHEN 500 MG PO TABS
ORAL_TABLET | ORAL | Status: AC
  Filled 2023-12-07: qty 2

## 2023-12-07 MED ORDER — FLUORESCEIN SODIUM 10 % IV SOLN
INTRAVENOUS | Status: DC | PRN
  Administered 2023-12-07: .5 mL via INTRAVENOUS

## 2023-12-07 MED ORDER — FENTANYL CITRATE (PF) 100 MCG/2ML IJ SOLN
INTRAMUSCULAR | Status: AC
Start: 2023-12-07 — End: 2023-12-07
  Filled 2023-12-07: qty 2

## 2023-12-07 MED ORDER — SODIUM CHLORIDE 0.9 % IV BOLUS
1000.0000 mL | Freq: Once | INTRAVENOUS | Status: AC
  Administered 2023-12-07: 1000 mL via INTRAVENOUS

## 2023-12-07 MED ORDER — BUPIVACAINE HCL 0.5 % IJ SOLN
INTRAMUSCULAR | Status: DC | PRN
  Administered 2023-12-07: 10 mL

## 2023-12-07 MED ORDER — SILVER NITRATE-POT NITRATE 75-25 % EX MISC
CUTANEOUS | Status: AC
  Filled 2023-12-07: qty 10

## 2023-12-07 MED ORDER — ROCURONIUM BROMIDE 10 MG/ML (PF) SYRINGE
PREFILLED_SYRINGE | INTRAVENOUS | Status: AC
  Filled 2023-12-07: qty 10

## 2023-12-07 MED ORDER — GABAPENTIN 300 MG PO CAPS
ORAL_CAPSULE | ORAL | Status: AC
  Filled 2023-12-07: qty 1

## 2023-12-07 MED ORDER — FENTANYL CITRATE (PF) 100 MCG/2ML IJ SOLN
25.0000 ug | INTRAMUSCULAR | Status: DC | PRN
  Administered 2023-12-07: 25 ug via INTRAVENOUS
  Administered 2023-12-07: 50 ug via INTRAVENOUS
  Administered 2023-12-07: 25 ug via INTRAVENOUS

## 2023-12-07 MED ORDER — SUGAMMADEX SODIUM 200 MG/2ML IV SOLN
INTRAVENOUS | Status: DC | PRN
  Administered 2023-12-07: 200 mg via INTRAVENOUS

## 2023-12-07 MED ORDER — DEXMEDETOMIDINE HCL IN NACL 80 MCG/20ML IV SOLN
INTRAVENOUS | Status: DC | PRN
  Administered 2023-12-07: 8 ug via INTRAVENOUS
  Administered 2023-12-07 (×2): 4 ug via INTRAVENOUS
  Administered 2023-12-07: 8 ug via INTRAVENOUS

## 2023-12-07 MED ORDER — GABAPENTIN 300 MG PO CAPS: 300.0000 mg | ORAL_CAPSULE | ORAL | Status: DC

## 2023-12-07 MED ORDER — BUPIVACAINE HCL (PF) 0.5 % IJ SOLN
INTRAMUSCULAR | Status: AC
  Filled 2023-12-07: qty 30

## 2023-12-07 MED ORDER — FENTANYL CITRATE (PF) 100 MCG/2ML IJ SOLN
INTRAMUSCULAR | Status: DC | PRN
  Administered 2023-12-07 (×4): 25 ug via INTRAVENOUS
  Administered 2023-12-07 (×2): 50 ug via INTRAVENOUS

## 2023-12-07 MED ORDER — OXYCODONE HCL 5 MG PO TABS
ORAL_TABLET | ORAL | Status: AC
  Filled 2023-12-07: qty 1

## 2023-12-07 MED ORDER — KETOROLAC TROMETHAMINE 30 MG/ML IJ SOLN
INTRAMUSCULAR | Status: DC | PRN
  Administered 2023-12-07: 30 mg via INTRAVENOUS

## 2023-12-07 MED ORDER — SODIUM CHLORIDE 0.9 % IR SOLN
Status: DC | PRN
  Administered 2023-12-07: 100 mL via INTRAVESICAL

## 2023-12-07 MED ORDER — OXYCODONE HCL 5 MG/5ML PO SOLN: 5.0000 mg | Freq: Once | ORAL | Status: AC | PRN

## 2023-12-07 MED ORDER — HEMOSTATIC AGENTS (NO CHARGE) OPTIME
TOPICAL | Status: DC | PRN
  Administered 2023-12-07: 1 via TOPICAL

## 2023-12-07 MED ORDER — SODIUM CHLORIDE 0.9 % IR SOLN
Status: DC | PRN
  Administered 2023-12-07: 1000 mL

## 2023-12-07 MED ORDER — LEVETIRACETAM 750 MG PO TABS
750.0000 mg | ORAL_TABLET | Freq: Once | ORAL | Status: AC
  Administered 2023-12-07: 750 mg via ORAL
  Filled 2023-12-07: qty 1

## 2023-12-07 MED ORDER — LIDOCAINE HCL (CARDIAC) PF 100 MG/5ML IV SOSY
PREFILLED_SYRINGE | INTRAVENOUS | Status: DC | PRN
  Administered 2023-12-07: 60 mg via INTRAVENOUS

## 2023-12-07 MED ORDER — PROPOFOL 10 MG/ML IV BOLUS
INTRAVENOUS | Status: AC
  Filled 2023-12-07: qty 20

## 2023-12-07 MED ORDER — METHYLENE BLUE (ANTIDOTE) 1 % IV SOLN
INTRAVENOUS | Status: AC
  Filled 2023-12-07: qty 10

## 2023-12-07 MED ORDER — LIDOCAINE HCL (PF) 2 % IJ SOLN
INTRAMUSCULAR | Status: AC
  Filled 2023-12-07: qty 5

## 2023-12-07 MED ORDER — MIDAZOLAM HCL 2 MG/2ML IJ SOLN
INTRAMUSCULAR | Status: DC | PRN
Start: 2023-12-07 — End: 2023-12-07
  Administered 2023-12-07: 2 mg via INTRAVENOUS

## 2023-12-07 MED ORDER — METHYLENE BLUE (ANTIDOTE) 1 % IV SOLN
INTRAVENOUS | Status: DC | PRN
  Administered 2023-12-07: .5 mL via INTRAVENOUS

## 2023-12-07 MED ORDER — PROPOFOL 10 MG/ML IV BOLUS
INTRAVENOUS | Status: DC | PRN
  Administered 2023-12-07: 150 mg via INTRAVENOUS

## 2023-12-07 MED ORDER — HYDROMORPHONE HCL 1 MG/ML IJ SOLN: 0.5000 mg | INTRAMUSCULAR | Status: DC | PRN

## 2023-12-07 MED ORDER — LACTATED RINGERS IV SOLN: INTRAVENOUS | Status: DC

## 2023-12-07 MED ORDER — KETOROLAC TROMETHAMINE 30 MG/ML IJ SOLN
15.0000 mg | Freq: Once | INTRAMUSCULAR | Status: AC
  Administered 2023-12-07: 15 mg via INTRAVENOUS
  Filled 2023-12-07: qty 1

## 2023-12-07 MED ORDER — POVIDONE-IODINE 10 % EX SWAB
2.0000 | Freq: Once | CUTANEOUS | Status: DC
  Filled 2023-12-07: qty 4

## 2023-12-07 SURGICAL SUPPLY — 43 items
BAG URINE DRAIN 2000ML AR STRL (UROLOGICAL SUPPLIES) IMPLANT
BENZOIN TINCTURE PRP APPL 2/3 (GAUZE/BANDAGES/DRESSINGS) ×1 IMPLANT
BLADE SURG SZ11 CARB STEEL (BLADE) ×1 IMPLANT
CATH ROBINSON RED A/P 16FR (CATHETERS) ×1 IMPLANT
CATH URTH 16FR FL 2W BLN LF (CATHETERS) ×1 IMPLANT
CHLORAPREP W/TINT 26 (MISCELLANEOUS) ×1 IMPLANT
DRSG TEGADERM 2-3/8X2-3/4 SM (GAUZE/BANDAGES/DRESSINGS) ×1 IMPLANT
GAUZE 4X4 16PLY ~~LOC~~+RFID DBL (SPONGE) ×1 IMPLANT
GAUZE SPONGE 2X2 STRL 8-PLY (GAUZE/BANDAGES/DRESSINGS) ×1 IMPLANT
GLOVE SURG SYN 8.0 PF PI (GLOVE) ×2 IMPLANT
GOWN STRL REUS W/ TWL LRG LVL3 (GOWN DISPOSABLE) ×1 IMPLANT
GOWN STRL REUS W/ TWL XL LVL3 (GOWN DISPOSABLE) ×2 IMPLANT
GRASPER SUT TROCAR 14GX15 (MISCELLANEOUS) ×1 IMPLANT
IRRIGATION STRYKERFLOW (MISCELLANEOUS) ×1 IMPLANT
IV NS 1000ML BAXH (IV SOLUTION) ×1 IMPLANT
KIT PINK PAD W/HEAD ARM REST (MISCELLANEOUS) ×1 IMPLANT
KIT TURNOVER CYSTO (KITS) ×1 IMPLANT
KITTNER LAPARASCOPIC 5X40 (MISCELLANEOUS) IMPLANT
LABEL OR SOLS (LABEL) ×1 IMPLANT
LIGASURE LAP MARYLAND 5MM 37CM (ELECTROSURGICAL) IMPLANT
MANIFOLD NEPTUNE II (INSTRUMENTS) ×1 IMPLANT
NS IRRIG 500ML POUR BTL (IV SOLUTION) ×1 IMPLANT
PACK GYN LAPAROSCOPIC (MISCELLANEOUS) ×1 IMPLANT
PAD OB MATERNITY 11 LF (PERSONAL CARE ITEMS) ×1 IMPLANT
PAD PREP OB/GYN DISP 24X41 (PERSONAL CARE ITEMS) ×1 IMPLANT
POWDER SURGICEL 3.0 GRAM (HEMOSTASIS) IMPLANT
SCISSORS LAP 5X35 DISP (ENDOMECHANICALS) IMPLANT
SET CYSTO W/LG BORE CLAMP LF (SET/KITS/TRAYS/PACK) IMPLANT
SET TUBE SMOKE EVAC HIGH FLOW (TUBING) ×1 IMPLANT
SHEARS HARMONIC 36 ACE (MISCELLANEOUS) ×1 IMPLANT
SLEEVE Z-THREAD 5X100MM (TROCAR) ×1 IMPLANT
SOLUTION PREP PVP 2OZ (MISCELLANEOUS) ×1 IMPLANT
STRIP CLOSURE SKIN 1/4X4 (GAUZE/BANDAGES/DRESSINGS) ×1 IMPLANT
SUT VIC AB 0 CT1 36 (SUTURE) ×1 IMPLANT
SUT VIC AB 2-0 UR6 27 (SUTURE) IMPLANT
SUT VIC AB 4-0 SH 27XANBCTRL (SUTURE) ×1 IMPLANT
SYR 50ML LL SCALE MARK (SYRINGE) IMPLANT
SYSTEM BAG RETRIEVAL 10MM (BASKET) ×1 IMPLANT
TIP ENDOSCOPIC SURGICEL (TIP) IMPLANT
TRAP FLUID SMOKE EVACUATOR (MISCELLANEOUS) ×1 IMPLANT
TROCAR Z-THRD FIOS HNDL 11X100 (TROCAR) ×1 IMPLANT
TROCAR Z-THREAD FIOS 5X100MM (TROCAR) ×1 IMPLANT
WATER STERILE IRR 500ML POUR (IV SOLUTION) ×1 IMPLANT

## 2023-12-07 NOTE — Discharge Summary (Signed)
 GYN discharge summary:  Date of admission 12/07/2023  Date of discharge 12/07/2023   Admission diagnosispelvic pain , bilateral ovarian cyst Discharge diagnosiStage 4 endometriosis, significant pelvic adhesions Procedure:l/s LSO , pelvic adhesiolysis, right ovarian cystotomy , cystoscopy  Hospital course :pt sen in ED the day of the procedure and underwent the above procedure . D/C post op    Studies:  Results for orders placed or performed during the hospital encounter of 12/07/23 (from the past 24 hours)  POC Urine Pregnancy, ED     Status: None   Collection Time: 12/07/23  5:44 AM  Result Value Ref Range   Preg Test, Ur Negative Negative  Urinalysis, w/ Reflex to Culture (Infection Suspected) -Urine, Clean Catch     Status: Abnormal   Collection Time: 12/07/23  6:11 AM  Result Value Ref Range   Specimen Source URINE, CLEAN CATCH    Color, Urine YELLOW (A) YELLOW   APPearance HAZY (A) CLEAR   Specific Gravity, Urine 1.016 1.005 - 1.030   pH 9.0 (H) 5.0 - 8.0   Glucose, UA NEGATIVE NEGATIVE mg/dL   Hgb urine dipstick NEGATIVE NEGATIVE   Bilirubin Urine NEGATIVE NEGATIVE   Ketones, ur NEGATIVE NEGATIVE mg/dL   Protein, ur 30 (A) NEGATIVE mg/dL   Nitrite NEGATIVE NEGATIVE   Leukocytes,Ua MODERATE (A) NEGATIVE   RBC / HPF 6-10 0 - 5 RBC/hpf   WBC, UA >50 0 - 5 WBC/hpf   Bacteria, UA RARE (A) NONE SEEN   Squamous Epithelial / HPF 11-20 0 - 5 /HPF   Mucus PRESENT   Comprehensive metabolic panel     Status: Abnormal   Collection Time: 12/07/23  6:39 AM  Result Value Ref Range   Sodium 138 135 - 145 mmol/L   Potassium 3.9 3.5 - 5.1 mmol/L   Chloride 108 98 - 111 mmol/L   CO2 22 22 - 32 mmol/L   Glucose, Bld 100 (H) 70 - 99 mg/dL   BUN 11 6 - 20 mg/dL   Creatinine, Ser 9.26 0.44 - 1.00 mg/dL   Calcium 8.6 (L) 8.9 - 10.3 mg/dL   Total Protein 7.2 6.5 - 8.1 g/dL   Albumin 3.6 3.5 - 5.0 g/dL   AST 826 (H) 15 - 41 U/L   ALT 158 (H) 0 - 44 U/L   Alkaline Phosphatase 81 38 -  126 U/L   Total Bilirubin 0.6 0.0 - 1.2 mg/dL   GFR, Estimated >39 >39 mL/min   Anion gap 8 5 - 15  CBC with Differential     Status: Abnormal   Collection Time: 12/07/23  6:39 AM  Result Value Ref Range   WBC 10.8 (H) 4.0 - 10.5 K/uL   RBC 4.05 3.87 - 5.11 MIL/uL   Hemoglobin 10.9 (L) 12.0 - 15.0 g/dL   HCT 65.8 (L) 63.9 - 53.9 %   MCV 84.2 80.0 - 100.0 fL   MCH 26.9 26.0 - 34.0 pg   MCHC 32.0 30.0 - 36.0 g/dL   RDW 84.8 88.4 - 84.4 %   Platelets 276 150 - 400 K/uL   nRBC 0.0 0.0 - 0.2 %   Neutrophils Relative % 38 %   Neutro Abs 4.1 1.7 - 7.7 K/uL   Lymphocytes Relative 54 %   Lymphs Abs 5.8 (H) 0.7 - 4.0 K/uL   Monocytes Relative 7 %   Monocytes Absolute 0.8 0.1 - 1.0 K/uL   Eosinophils Relative 0 %   Eosinophils Absolute 0.0 0.0 - 0.5 K/uL  Basophils Relative 1 %   Basophils Absolute 0.1 0.0 - 0.1 K/uL   WBC Morphology MORPHOLOGY UNREMARKABLE    RBC Morphology MORPHOLOGY UNREMARKABLE    Smear Review Normal platelet morphology    Immature Granulocytes 0 %   Abs Immature Granulocytes 0.03 0.00 - 0.07 K/uL  Type and screen [facility]     Status: None   Collection Time: 12/07/23  1:02 PM  Result Value Ref Range   ABO/RH(D) A POS    Antibody Screen POS    Sample Expiration 12/10/2023,2359    Antibody Identification      NON SPECIFIC ANTIBODY REACTIVITY Performed at [facility], [address], [place], [place] [address]      Labs :  Disposition :d/c home  D/c meds:  Percocet 5/325 1-2 q 4-6 hr prn   Motrin  800 mg q 8 hrs prn  Zofran  8 mg q 8 hrs prn n/v Gabapentin  300 mg at bedtime     Follow up in 12/11/23 at [facility]  with Dr Lovetta  , [place]

## 2023-12-07 NOTE — ED Provider Notes (Signed)
 US  PELVIC DOPPLER (TORSION R/O OR MASS ARTERIAL FLOW) Result Date: 12/07/2023 CLINICAL DATA:  23 year old female with pelvic pain for 1 day. Septated and cystic 11 cm pelvic mass on CT. EXAM: TRANSABDOMINAL AND TRANSVAGINAL ULTRASOUND OF PELVIS DOPPLER ULTRASOUND OF OVARIES TECHNIQUE: Both transabdominal and transvaginal ultrasound examinations of the pelvis were performed. Transabdominal technique was performed for global imaging of the pelvis including uterus, ovaries, adnexal regions, and pelvic cul-de-sac. It was necessary to proceed with endovaginal exam following the transabdominal exam to visualize the ovaries. Color and duplex Doppler ultrasound was utilized to evaluate blood flow to the ovaries. COMPARISON:  Noncontrast CT Abdomen and Pelvis 0609 hours today. FINDINGS: Uterus Measurements: 6.0 x 3.8 x 5.9 cm = volume: 70 mL. No fibroids or other mass visualized. Endometrium Thickness: 4 mm.  No focal abnormality visualized. Right ovary Measurements: 6.6 x 3.3 x 6.1 cm = volume: 70 mL. Partially septated 5 cm hypoechoic right ovarian cyst or adjacent cysts with vascular internal septation or cyst wall (image 93), low level echoes throughout the lesion(s). Superimposed large anterior pelvic similar cystic mass with homogeneous low level echoes, corresponding to the dominant CT lesion, 11 cm long axis (image 102). Much of this lacks internal vascular elements (images 97 and 101). However, transabdominal images do demonstrate vascular internal septation or lesion wall on image 55 series 1. Left ovary Measurements: Difficult to delineate, there appears to be some normal ovarian parenchyma inseparable from the dominant anterior mass (see cine series 2). And Doppler flow is detected within that area. Pulsed Doppler evaluation of both ovaries demonstrates normal low-resistance arterial and venous waveforms. Other findings No abnormal free fluid. IMPRESSION: 1. Complex bilateral ovarian cysts or cystic masses  consisting mostly of homogeneous low level internal echoes, but with superimposed internal vascular septations versus adjacent lesions with vascular cyst wall. Favor multiple Endometriomas (up to 11 cm and dominant lesion likely arising from the left ovary), however, difficult to exclude ovarian neoplasm. Recommend GYN Surgery consultation. 2. No evidence of ovarian torsion.  Negative uterus. Electronically Signed   By: VEAR Hurst M.D.   On: 12/07/2023 08:19   CT ABDOMEN PELVIS WO CONTRAST Result Date: 12/07/2023 EXAM: CT ABDOMEN AND PELVIS WITHOUT CONTRAST 12/07/2023 06:12:48 AM TECHNIQUE: CT of the abdomen and pelvis was performed without the administration of intravenous contrast. Multiplanar reformatted images are provided for review. Automated exposure control, iterative reconstruction, and/or weight-based adjustment of the mA/kV was utilized to reduce the radiation dose to as low as reasonably achievable. COMPARISON: 10/09/2015 CLINICAL HISTORY: Abdominal pain, acute, nonlocalized; Abdominal/flank pain, stone suspected; Bowel obstruction suspected. Table formatting from the original note was not included.; Patient arrived by [facility] with complaints of constipation for over a week. States she has IBS and this happens sometimes but never caused pain and lasted this long. She said she has had some small BMs but not much is coming out and it's very painful. Even ; difficult to pass gas. Back and stomach is hurting.; ; She has tried suppositories, miralax and some kind of wax thing and nothing worked. It made her stomach cramp and caused her to sweat. FINDINGS: LOWER CHEST: Similar appearance of small bilateral lower lobe lung nodules. No largest is in the periphery of the right lower lobe measuring 4 mm. These findings most likely reflect sequelae of remote inflammation or infection. No acute findings within the imaged portions of the lung bases. LIVER: Normal size and contour. GALLBLADDER AND BILE DUCTS: No wall  thickening. No cholelithiasis. No biliary ductal  dilatation. SPLEEN: Normal size. No focal lesion. PANCREAS: No mass. No ductal dilatation. ADRENAL GLANDS: Normal appearance. No mass. KIDNEYS, URETERS AND BLADDER: No stones in the kidneys or ureters. No hydronephrosis. No perinephric or periureteral stranding. Urinary bladder is unremarkable. GI AND BOWEL: The appendix is suboptimally visualized. No secondary signs of acute appendicitis. No pathologic dilatation of the large or small bowel loops. No signs of bowel inflammation. PERITONEUM AND RETROPERITONEUM: No signs of pneumoperitoneum. No significant free fluid or fluid collections. VASCULATURE: Aorta is normal in caliber. LYMPH NODES: No lymphadenopathy. REPRODUCTIVE ORGANS: Pelvic organs are suboptimally visualized due to lack of contrast material. There is a septated cystic mass within the anterior pelvis which measures 11.1 x 7.4 cm, image 63/2. Incompletely characterized without IV contrast. BONES AND SOFT TISSUES: No acute osseous abnormality. No focal soft tissue abnormality. IMPRESSION: 1. Septated cystic mass within the anterior pelvis measuring 11.1 x 7.4 cm, incompletely characterized without IV contrast. Favor ovarian origin. Recommend further characterization with pelvic sonogram. Electronically signed by: Waddell Calk MD 12/07/2023 06:28 AM EDT RP Workstation: [id number]    ----------------------------------------- 8:48 AM on 12/07/2023 ----------------------------------------- OB/GYN paged.  Patient reports pain is improved at this time, she does not wish for additional pain medication but is very concerned about her difficulty and inability to seem to pass stool worsening.  Still able to pass gas and very small hard stools.  She advises she recently engaged with [facility] primary care for this concern, they had seen her but she never had further follow-up recently.  She does not currently have a OB/GYN provider here in [facility]  Clinical Course as of 12/07/23 9095  Sat Dec 07, 2023  9147 Page and lucrecia message sent to Dr. ONEIDA Dinsmore, formal OB consult has been requested. [MQ]  0903 Dr. Dinsmore advises he will be coming to see him provide consult on the patient with about 1 to 2 hours.  He does recommend keeping the patient n.p.o. at this time in case of operative need. [MQ]    Clinical Course User Index [MQ] Dicky Anes, MD       Dicky Anes, MD 12/07/23 [phone]

## 2023-12-07 NOTE — ED Notes (Signed)
 Report to OR RN.

## 2023-12-07 NOTE — ED Provider Notes (Signed)
 Patient seen by Dr. Lovetta.  He advises admission to [facility] service, planning for operative.  Request fleets enema be administered prior, order written.  Patient will be admitted to [facility]   Dicky Anes, MD 12/07/23 1052

## 2023-12-07 NOTE — Transfer of Care (Signed)
 Immediate Anesthesia Transfer of Care Note  Patient: Allison Dawson  Procedure(s) Performed: LEFT SALPINGOOPHERECTOMY LAPAROSCOPIC, LYSYIS O AND RIGHT OVARIAN CYSTCYSTOSCOPY (Left)  Patient Location: PACU  Anesthesia Type:General  Level of Consciousness: drowsy  Airway & Oxygen Therapy: Patient Spontanous Breathing and Patient connected to face mask oxygen  Post-op Assessment: Report given to RN and Post -op Vital signs reviewed and stable  Post vital signs: Reviewed and stable  Last Vitals:  Vitals Value Taken Time  BP    Temp    Pulse    Resp    SpO2      Last Pain:  Vitals:   12/07/23 1205  TempSrc: Oral  PainSc: 6          Complications: No notable events documented.

## 2023-12-07 NOTE — Consult Note (Addendum)
 Consult History and Physical   SERVICE: Gynecology unassigned [facility]   Patient Name: Allison Dawson Patient MRN:   [id number]  CC:1 week of worsen lower pelvic pain .   HPI: Allison Dawson is a 23 y.o. G1P1001 with bilateral lower pelvic pain + constipation , pressure .  Pt seen 2 weeks ago at [facility] and an u/s was ordered ( never done)    Review of Systems: positives in bold GEN:   fevers, chills, weight changes, appetite changes, fatigue, night sweats HEENT:  HA, vision changes, hearing loss, congestion, rhinorrhea, sinus pressure, dysphagia CV:   CP, palpitations PULM:  SOB, cough GI:  abd pain, N/V/D/C GU:  dysuria, urgency, frequency, pelvic pain / pressure  MSK:  arthralgias, myalgias, back pain, swelling SKIN:  rashes, color changes, pallor NEURO:  numbness, weakness, tingling, seizures, dizziness, tremors PSYCH:  depression, anxiety, behavioral problems, confusion  HEME/LYMPH:  easy bruising or bleeding ENDO:  heat/cold intolerance  Past Obstetrical History: OB History     Gravida  1   Para  1   Term  1   Preterm  0   AB  0   Living  1      SAB  0   IAB  0   Ectopic  0   Multiple  0   Live Births  1           Past Gynecologic History: No LMP recorded. 11/08/23  Past Medical History: Past Medical History:  Diagnosis Date   Epilepsy (HCC)    Ovarian cyst    Seizures (HCC)    Strep pharyngitis     Past Surgical History:   Past Surgical History:  Procedure Laterality Date   CESAREAN SECTION N/A 02/28/2017   Procedure: CESAREAN SECTION;  Surgeon: Lorence Ozell CROME, MD;  Location: [facility] BIRTHING SUITES;  Service: Obstetrics;  Laterality: N/A;   WISDOM TOOTH EXTRACTION      Family History:  family history includes Diabetes in her maternal grandfather and another family member.  Social History:  Social History   Socioeconomic History   Marital status: Single    Spouse name: Not on file   Number of children: 1   Years of  education: 10   Highest education level: Not on file  Occupational History   Occupation: stay at home mom   Tobacco Use   Smoking status: Former    Current packs/day: 0.00    Types: Cigarettes    Quit date: 05/27/2016    Years since quitting: 7.5   Smokeless tobacco: Never  Vaping Use   Vaping status: Never Used  Substance and Sexual Activity   Alcohol use: No   Drug use: No   Sexual activity: Not Currently    Birth control/protection: None  Other Topics Concern   Not on file  Social History Narrative   Lives at home with mom & newborn baby boy (born on 02/[birth year]/17)   Right handed   No caffeine use   Social Drivers of Corporate investment banker Strain: Low Risk  (10/22/2022)   Received from [facility]   Overall Financial Resource Strain (CARDIA)    Difficulty of Paying Living Expenses: Not very hard  Food Insecurity: No Food Insecurity (10/22/2022)   Received from [facility]   Hunger Vital Sign    Within the past 12 months, you worried that your food would run out before you got the money to buy more.: Never true    Within the past 12  months, the food you bought just didn't last and you didn't have money to get more.: Never true  Transportation Needs: No Transportation Needs (10/22/2022)   Received from [facility] - Transportation    Lack of Transportation (Medical): No    Lack of Transportation (Non-Medical): No  Physical Activity: Insufficiently Active (10/22/2022)   Received from [facility]   Exercise Vital Sign    On average, how many days per week do you engage in moderate to strenuous exercise (like a brisk walk)?: 2 days    On average, how many minutes do you engage in exercise at this level?: 30 min  Stress: Stress Concern Present (10/22/2022)   Received from [facility] - Occupational Stress Questionnaire    Feeling of Stress : Rather much  Social Connections: Socially Integrated (10/22/2022)    Received from [facility]   Social Network    How would you rate your social network (family, work, friends)?: Good participation with social networks  Intimate Partner Violence: Not At Risk (10/22/2022)   Received from [facility]   HITS    Over the last 12 months how often did your partner physically hurt you?: Never    Over the last 12 months how often did your partner insult you or talk down to you?: Never    Over the last 12 months how often did your partner threaten you with physical harm?: Never    Over the last 12 months how often did your partner scream or curse at you?: Never    Home Medications:  Medications reconciled in EPIC  No current facility-administered medications on file prior to encounter.   Current Outpatient Medications on File Prior to Encounter  Medication Sig Dispense Refill   acetaminophen  (TYLENOL ) 325 MG tablet Take 650 mg by mouth every 6 (six) hours as needed for moderate pain or headache.     calcium carbonate (TUMS - DOSED IN MG ELEMENTAL CALCIUM) 500 MG chewable tablet Chew 2 tablets by mouth 3 (three) times daily as needed for indigestion or heartburn.     albuterol  (PROVENTIL  HFA;VENTOLIN  HFA) 108 (90 Base) MCG/ACT inhaler Inhale 1 puff into the lungs every 6 (six) hours as needed for wheezing or shortness of breath. (Patient not taking: Reported on 12/07/2023)     levETIRAcetam  (KEPPRA ) 750 MG tablet Take 1 tablet (750 mg total) by mouth 2 (two) times daily. 180 tablet 1   ondansetron  (ZOFRAN  ODT) 8 MG disintegrating tablet Take 1 tablet (8 mg total) by mouth every 8 (eight) hours as needed for nausea or vomiting. (Patient not taking: Reported on 12/07/2023) 10 tablet 0   Prenat-FeAsp-Meth-FA-DHA w/o A (PRENATE PIXIE ) 10-0.6-0.4-200 MG CAPS Take 1 tablet by mouth daily. (Patient not taking: Reported on 12/07/2023) 30 capsule 12    Allergies:  Allergies  Allergen Reactions   Diflucan  [Fluconazole ] Anaphylaxis and Itching    Reports burning of skin  all over   Phenazopyridine Other (See Comments) and Rash    Patient states,  It makes my body burn real bad. Patient states,  It makes my body burn real bad. Patient states,  It makes my body burn real bad. Patient states,  It makes my body burn real bad. Patient states,  It makes my body burn real bad.     Physical Exam:  Temp:  [98.9 F (37.2 C)-99 F (37.2 C)] 99 F (37.2 C) (08/30 0856) Pulse Rate:  [80-92] 80 (08/30 0856) Resp:  [  17-18] 17 (08/30 0856) BP: (109-123)/(71-89) 123/87 (08/30 0856) SpO2:  [99 %-100 %] 99 % (08/30 0856) Weight:  [54.4 kg] 54.4 kg (08/30 0443)   General Appearance:  Well developed, well nourished, no acute distress, alert and oriented x3 HEENT:  Normocephalic atraumatic, extraocular movements intact, moist mucous membranes Cardiovascular:  Normal S1/S2, regular rate and rhythm, no murmurs Pulmonary:  clear to auscultation, no wheezes, rales or rhonchi, symmetric air entry, good air exchange Abdomen:  Bowel sounds present, soft, ++ TTP , nondistended, no abnormal masses, no epigastric pain Extremities:  Full range of motion, no pedal edema, 2+ distal pulses, no tenderness Skin:  normal coloration and turgor, no rashes, no suspicious skin lesions noted  Neurologic:  Cranial nerves 2-12 grossly intact, normal muscle tone, strength 5/5 all four extremities Psychiatric:  Normal mood and affect, appropriate, no AH/VH Pelvic: deferred   Labs/Studies:   CBC and Coags:  Lab Results  Component Value Date   WBC 10.8 (H) 12/07/2023   NEUTOPHILPCT 38 12/07/2023   EOSPCT 0 12/07/2023   BASOPCT 1 12/07/2023   LYMPHOPCT 54 12/07/2023   HGB 10.9 (L) 12/07/2023   HCT 34.1 (L) 12/07/2023   MCV 84.2 12/07/2023   PLT 276 12/07/2023   CMP:  Lab Results  Component Value Date   NA 138 12/07/2023   K 3.9 12/07/2023   CL 108 12/07/2023   CO2 22 12/07/2023   BUN 11 12/07/2023   CREATININE 0.73 12/07/2023   CREATININE 0.66 01/09/2022    CREATININE 0.53 01/19/2017   PROT 7.2 12/07/2023   BILITOT 0.6 12/07/2023   ALT 158 (H) 12/07/2023   AST 173 (H) 12/07/2023   ALKPHOS 81 12/07/2023    Other Imaging: US  PELVIC DOPPLER (TORSION R/O OR MASS ARTERIAL FLOW) Result Date: 12/07/2023 CLINICAL DATA:  23 year old female with pelvic pain for 1 day. Septated and cystic 11 cm pelvic mass on CT. EXAM: TRANSABDOMINAL AND TRANSVAGINAL ULTRASOUND OF PELVIS DOPPLER ULTRASOUND OF OVARIES TECHNIQUE: Both transabdominal and transvaginal ultrasound examinations of the pelvis were performed. Transabdominal technique was performed for global imaging of the pelvis including uterus, ovaries, adnexal regions, and pelvic cul-de-sac. It was necessary to proceed with endovaginal exam following the transabdominal exam to visualize the ovaries. Color and duplex Doppler ultrasound was utilized to evaluate blood flow to the ovaries. COMPARISON:  Noncontrast CT Abdomen and Pelvis 0609 hours today. FINDINGS: Uterus Measurements: 6.0 x 3.8 x 5.9 cm = volume: 70 mL. No fibroids or other mass visualized. Endometrium Thickness: 4 mm.  No focal abnormality visualized. Right ovary Measurements: 6.6 x 3.3 x 6.1 cm = volume: 70 mL. Partially septated 5 cm hypoechoic right ovarian cyst or adjacent cysts with vascular internal septation or cyst wall (image 93), low level echoes throughout the lesion(s). Superimposed large anterior pelvic similar cystic mass with homogeneous low level echoes, corresponding to the dominant CT lesion, 11 cm long axis (image 102). Much of this lacks internal vascular elements (images 97 and 101). However, transabdominal images do demonstrate vascular internal septation or lesion wall on image 55 series 1. Left ovary Measurements: Difficult to delineate, there appears to be some normal ovarian parenchyma inseparable from the dominant anterior mass (see cine series 2). And Doppler flow is detected within that area. Pulsed Doppler evaluation of both ovaries  demonstrates normal low-resistance arterial and venous waveforms. Other findings No abnormal free fluid. IMPRESSION: 1. Complex bilateral ovarian cysts or cystic masses consisting mostly of homogeneous low level internal echoes, but with superimposed internal vascular septations  versus adjacent lesions with vascular cyst wall. Favor multiple Endometriomas (up to 11 cm and dominant lesion likely arising from the left ovary), however, difficult to exclude ovarian neoplasm. Recommend GYN Surgery consultation. 2. No evidence of ovarian torsion.  Negative uterus. Electronically Signed   By: VEAR Hurst M.D.   On: 12/07/2023 08:19   CT ABDOMEN PELVIS WO CONTRAST Result Date: 12/07/2023 EXAM: CT ABDOMEN AND PELVIS WITHOUT CONTRAST 12/07/2023 06:12:48 AM TECHNIQUE: CT of the abdomen and pelvis was performed without the administration of intravenous contrast. Multiplanar reformatted images are provided for review. Automated exposure control, iterative reconstruction, and/or weight-based adjustment of the mA/kV was utilized to reduce the radiation dose to as low as reasonably achievable. COMPARISON: 10/09/2015 CLINICAL HISTORY: Abdominal pain, acute, nonlocalized; Abdominal/flank pain, stone suspected; Bowel obstruction suspected. Table formatting from the original note was not included.; Patient arrived by [facility] with complaints of constipation for over a week. States she has IBS and this happens sometimes but never caused pain and lasted this long. She said she has had some small BMs but not much is coming out and it's very painful. Even ; difficult to pass gas. Back and stomach is hurting.; ; She has tried suppositories, miralax and some kind of wax thing and nothing worked. It made her stomach cramp and caused her to sweat. FINDINGS: LOWER CHEST: Similar appearance of small bilateral lower lobe lung nodules. No largest is in the periphery of the right lower lobe measuring 4 mm. These findings most likely reflect sequelae of  remote inflammation or infection. No acute findings within the imaged portions of the lung bases. LIVER: Normal size and contour. GALLBLADDER AND BILE DUCTS: No wall thickening. No cholelithiasis. No biliary ductal dilatation. SPLEEN: Normal size. No focal lesion. PANCREAS: No mass. No ductal dilatation. ADRENAL GLANDS: Normal appearance. No mass. KIDNEYS, URETERS AND BLADDER: No stones in the kidneys or ureters. No hydronephrosis. No perinephric or periureteral stranding. Urinary bladder is unremarkable. GI AND BOWEL: The appendix is suboptimally visualized. No secondary signs of acute appendicitis. No pathologic dilatation of the large or small bowel loops. No signs of bowel inflammation. PERITONEUM AND RETROPERITONEUM: No signs of pneumoperitoneum. No significant free fluid or fluid collections. VASCULATURE: Aorta is normal in caliber. LYMPH NODES: No lymphadenopathy. REPRODUCTIVE ORGANS: Pelvic organs are suboptimally visualized due to lack of contrast material. There is a septated cystic mass within the anterior pelvis which measures 11.1 x 7.4 cm, image 63/2. Incompletely characterized without IV contrast. BONES AND SOFT TISSUES: No acute osseous abnormality. No focal soft tissue abnormality. IMPRESSION: 1. Septated cystic mass within the anterior pelvis measuring 11.1 x 7.4 cm, incompletely characterized without IV contrast. Favor ovarian origin. Recommend further characterization with pelvic sonogram. Electronically signed by: Waddell Calk MD 12/07/2023 06:28 AM EDT RP Workstation: [id number]     Assessment / Plan:   Allison Dawson is a 23 y.o. G1P1001 who presents with subacute pelvic pain , worsening . U/S shows bilateral ovarian cyst c/w endometriomas Strong FHX of endometriosis 1. Ovarian endometriomas  Laparoscopic ovarian cystectomies . I have counseled the patient for risks of surgery .Possible organ injury , possible blood transfusion , possible left oophorectomy  60 minutes in pt care    Thank you for the opportunity to be involved with this pt's care.

## 2023-12-07 NOTE — ED Notes (Signed)
 This RN at bedside. Pt insisted on allowing significant other to administer enema. This RN verbalized instructions to significant other and they verbalized understanding. This RN stepped out the room to provide privacy and gave pt the call bell in case help is needed.

## 2023-12-07 NOTE — Op Note (Signed)
  Procedure Date:  12/07/2023  Pre-operative Diagnosis:  Bilateral ovarian cysts  Post-operative Diagnosis: Bilateral ovarian endometriomas, extensive pelvic adhesions, Stage IV endometriosis  Procedure:  Laparoscopic left salpingo-oophorectomy, lysis of adhesions, right ovarian cystotomy  Surgeon:  Debby Dinsmore, MD; Aloysius Sheree Plant, MD  Anesthesia:  General endotracheal  Indications for Procedure:  This is a 23 y.o. female currently undergoing surgery with Dr. Dinsmore for suspected bilateral ovarian cyst.  Due to extensive adhesions of the right ovary to the rectosigmoid junction, I was called into the OR for further assistance.  Description of Procedure: Patient was already prepped, draped, under general anesthesia, with three 5 mm laparoscopic ports in place -- right abdomen, umbilical, and left abdomen.  Dr. Dinsmore was able to mostly mobilize the left ovary and noted that there was a section that was adhered to the rectosigmoid junction.  I scrubbed in at this point.  After providing appropriate traction anteriorly, it was noted that the adhesions were from the ovary to the mesentery and epiploic fat of the rectosigmoid junction.  Using LigaSure, I was able to free up the adhesions using combination of cautery and blunt dissection.  An additional 5 mm port was placed in the left abdomen to assist with an additional instrument to help with retraction.  As the ovary was fully mobilized, it was then noted that the uterus was also adhered to the proximal rectum, in addition to a portion of the right ovary.  Further dissection was done to mobilize part of the uterus, but we reached a point where further dissection carried an increased risk of bowel or uterine injury.  At this point, the left ovary was fully mobilized off bowel or mesentery, and it was decided to perform a left salpingo-oophorectomy.  Please see Dr. Dinsmore for further details on his portion of the procedure.   One of the ports was upsized to a 12 mm port.  The specimen was placed in an endocatch bag and taken out through the left abdomen port site.  At this point, I scrubbed out.   Aloysius Sheree Plant, MD

## 2023-12-07 NOTE — ED Notes (Signed)
 Pt back from ultrasound, sig other at bedside, pt requests pain med, pain med given, pt on O2 sat monitor satting 100%

## 2023-12-07 NOTE — ED Notes (Signed)
 Pt in ultrasound

## 2023-12-07 NOTE — ED Notes (Signed)
 Pt in bed, pt reports increased pain, pt requests pain med, md notified, pain med given.

## 2023-12-07 NOTE — Anesthesia Preprocedure Evaluation (Signed)
 Anesthesia Evaluation  Patient identified by MRN, date of birth, ID band Patient awake    Reviewed: Allergy & Precautions, NPO status , Patient's Chart, lab work & pertinent test results  Airway Mallampati: II  TM Distance: >3 FB Neck ROM: Full    Dental no notable dental hx. (+) Chipped   Pulmonary neg pulmonary ROS, Current SmokerPatient did not abstain from smoking.   Pulmonary exam normal breath sounds clear to auscultation       Cardiovascular negative cardio ROS Normal cardiovascular exam Rhythm:Regular Rate:Normal     Neuro/Psych Seizures -, Well Controlled,  PSYCHIATRIC DISORDERS Anxiety     negative neurological ROS  negative psych ROS   GI/Hepatic negative GI ROS, Neg liver ROS,,,  Endo/Other  negative endocrine ROS    Renal/GU      Musculoskeletal   Abdominal   Peds  Hematology negative hematology ROS (+)   Anesthesia Other Findings Past Medical History: No date: Epilepsy (HCC) No date: Ovarian cyst No date: Seizures (HCC) No date: Strep pharyngitis  Past Surgical History: 02/28/2017: CESAREAN SECTION; N/A     Comment:  Procedure: CESAREAN SECTION;  Surgeon: Lorence Ozell CROME,              MD;  Location: [facility] BIRTHING SUIT[facility];  Service: Obstetrics;               Laterality: N/A; No date: WISDOM TOOTH EXTRACTION  BMI    Body Mass Index: 23.44 kg/m      Reproductive/Obstetrics negative OB ROS                              Anesthesia Physical Anesthesia Plan  ASA: 2  Anesthesia Plan: General ETT and General   Post-op Pain Management:    Induction: Intravenous  PONV Risk Score and Plan: 3 and Ondansetron , Dexamethasone  and Midazolam   Airway Management Planned: Oral ETT  Additional Equipment:   Intra-op Plan:   Post-operative Plan: Extubation in OR  Informed Consent: I have reviewed the patients History and Physical, chart, labs and discussed the procedure  including the risks, benefits and alternatives for the proposed anesthesia with the patient or authorized representative who has indicated his/her understanding and acceptance.     Dental Advisory Given  Plan Discussed with: Anesthesiologist, CRNA and Surgeon  Anesthesia Plan Comments: (Patient consented for risks of anesthesia including but not limited to:  - adverse reactions to medications - damage to eyes, teeth, lips or other oral mucosa - nerve damage due to positioning  - sore throat or hoarseness - Damage to heart, brain, nerves, lungs, other parts of body or loss of life  Patient voiced understanding and assent.)        Anesthesia Quick Evaluation

## 2023-12-07 NOTE — ED Notes (Signed)
 Pt in bed, pt requests pain med, md notified, pain med given, transport at bedside to take pt to OR, pt is anxious, talked with pt to decrease anxiety.

## 2023-12-07 NOTE — Anesthesia Procedure Notes (Signed)
 Procedure Name: Intubation Date/Time: 12/07/2023 1:16 PM  Performed by: Delores Evalene BROCKS, CRNAPre-anesthesia Checklist: Patient identified, Patient being monitored, Timeout performed, Emergency Drugs available and Suction available Patient Re-evaluated:Patient Re-evaluated prior to induction Oxygen Delivery Method: Circle system utilized Preoxygenation: Pre-oxygenation with 100% oxygen Induction Type: IV induction Ventilation: Mask ventilation without difficulty Laryngoscope Size: 3 and McGrath Grade View: Grade I Tube type: Oral Tube size: 6.5 mm Number of attempts: 1 Airway Equipment and Method: Stylet and Video-laryngoscopy Placement Confirmation: ETT inserted through vocal cords under direct vision, positive ETCO2 and breath sounds checked- equal and bilateral Secured at: 20 cm Tube secured with: Tape Dental Injury: Teeth and Oropharynx as per pre-operative assessment

## 2023-12-07 NOTE — Brief Op Note (Signed)
 12/07/2023  4:37 PM  PATIENT:  Allison Dawson  23 y.o. female  PRE-OPERATIVE DIAGNOSIS:  Bilateral ovarian cyst  POST-OPERATIVE DIAGNOSIS:  Bilateral ovarian endometriomas, extensive pelvic ADHESIONS, STAGE FOUR ENDOMETRIOSIS  PROCEDURE:    Laparoscopic left salpingo-ophorectomy  Extensive pelvic adhesiolysis Right ovarian cystotomy Cystoscopy    SURGEON:  Surgeons and Role:    * Allegra Cerniglia, Debby PARAS, MD - Primary    * Desiderio Schanz, MD - consultant gen surgery   PHYSICIAN ASSISTANT:   ASSISTANTS: cst   ANESTHESIA:   general  EBL:  50 mL iof 1700cc uo 200  BLOOD ADMINISTERED:none  DRAINS: none   LOCAL MEDICATIONS USED:  MARCAINE      SPECIMEN:  Source of Specimen:  left fallopian tube and over  DISPOSITION OF SPECIMEN:  PATHOLOGY  COUNTS:  YES  TOURNIQUET:  * No tourniquets in log *  DICTATION: .Other Dictation: Dictation Number verbal  PLAN OF CARE: Discharge to home after PACU  PATIENT DISPOSITION:  PACU - hemodynamically stable.   Delay start of Pharmacological VTE agent (>24hrs) due to surgical blood loss or risk of bleeding: not applicable

## 2023-12-07 NOTE — ED Provider Notes (Signed)
 Patient alert oriented.  Reports significant pain and appears uncomfortable reports after the ultrasound pain in her lower abdomen and back is increased.  I will give a dose of morphine .  Hemodynamics are normal.  Additionally, patient requesting her morning Keppra  dose.  This is currently ordered and pending.     Dicky Anes, MD 12/07/23 [phone]

## 2023-12-07 NOTE — ED Provider Notes (Signed)
 [facility] Provider Note    Event Date/Time   First MD Initiated Contact with Patient 12/07/23 [phone]     (approximate)   History   Constipation   HPI  Allison Dawson is a 23 y.o. female   Past medical history of seizures, ovarian cyst, IBS who presents to the Emergency Department with constipation.  Feels that her entire abdomen is full and she is bloated.  Has been making minimal bowel movements recently, hard stools, small stools, but passing flatus.  Feeling nauseated.  No urinary symptoms.  No GI bleeding.  Has been taking over-the-counter laxatives with no relief.     External Medical Documents Reviewed: Office visit with family medicine in July 25 noting abdominal bloating, nausea, irritable bowels      Physical Exam   Triage Vital Signs: ED Triage Vitals  Encounter Vitals Group     BP 12/07/23 0447 113/73     Girls Systolic BP Percentile --      Girls Diastolic BP Percentile --      Boys Systolic BP Percentile --      Boys Diastolic BP Percentile --      Pulse Rate 12/07/23 0447 92     Resp 12/07/23 0447 18     Temp 12/07/23 0447 98.9 F (37.2 C)     Temp Source 12/07/23 0447 Oral     SpO2 12/07/23 0447 100 %     Weight 12/07/23 0443 120 lb (54.4 kg)     Height 12/07/23 0443 5' (1.524 m)     Head Circumference --      Peak Flow --      Pain Score 12/07/23 0443 6     Pain Loc --      Pain Education --      Exclude from Growth Chart --     Most recent vital signs: Vitals:   12/07/23 0447  BP: 113/73  Pulse: 92  Resp: 18  Temp: 98.9 F (37.2 C)  SpO2: 100%    General: Awake, no distress.  CV:  Good peripheral perfusion.  Resp:  Normal effort.  Abd:  No distention.  Other:  Diffuse mild tenderness to palpation throughout the abdomen.  No focality and none peritoneal.  Left-sided CVA tenderness as well.  Normal vital signs.   ED Results / Procedures / Treatments   Labs (all labs ordered are listed, but only abnormal  results are displayed) Labs Reviewed  URINALYSIS, W/ REFLEX TO CULTURE (INFECTION SUSPECTED) - Abnormal; Notable for the following components:      Result Value   Color, Urine YELLOW (*)    APPearance HAZY (*)    pH 9.0 (*)    Protein, ur 30 (*)    Leukocytes,Ua MODERATE (*)    Bacteria, UA RARE (*)    All other components within normal limits  COMPREHENSIVE METABOLIC PANEL WITH GFR  CBC WITH DIFFERENTIAL/PLATELET  POC URINE PREG, ED     I ordered and reviewed the above labs they are notable for negative pregnancy test  RADIOLOGY I independently reviewed and interpreted CT scan of the abdomen and see mass in the lower abdomen I also reviewed radiologist's formal read.   PROCEDURES:  Critical Care performed: No  Procedures   MEDICATIONS ORDERED IN ED: Medications - No data to display  IMPRESSION / MDM / ASSESSMENT AND PLAN / ED COURSE  I reviewed the triage vital signs and the nursing notes.  Patient's presentation is most consistent with acute presentation with potential threat to life or bodily function.  Differential diagnosis includes, but is not limited to, IBS, constipation, obstruction, kidney stone, urine infection   MDM:    Chief complaint of constipation despite taking laxatives, still having very poor bowel movement small hard stools.  Has a history of ovarian cysts, no surgical abdominal history, with a complaint of sensation of distention, minimal flatus, minimal stools, also left flank pain, will get a CT abdomen to see if there is any obstruction, renal stone, or masses causing obstruction.  Check urinalysis for pregnancy or urine infection.  -- CT scan does not show bowel obstruction, but does show a large pelvic mass, and will order a pelvic ultrasound to better characterize.  Getting basic labs.  Doppler given left-sided lower pain along with suspected large ovarian mass to rule out torsion.  Given the extent of the  mass on CT examination and the severity of symptoms to the patient, I think it prudent to get a gynecology consultation after the ultrasound is performed and further elucidation of the mass is obtained.       FINAL CLINICAL IMPRESSION(S) / ED DIAGNOSES   Final diagnoses:  Constipation, unspecified constipation type  Pelvic mass     Rx / DC Orders   ED Discharge Orders     None        Note:  This document was prepared using Dragon voice recognition software and may include unintentional dictation errors.    Cyrena Mylar, MD 12/07/23 [phone]

## 2023-12-07 NOTE — ED Notes (Signed)
 Pt to OR.

## 2023-12-07 NOTE — ED Triage Notes (Signed)
 Patient arrived by [facility] with complaints of constipation for over a week.  States she has IBS and this happens sometimes but never caused pain and lasted this long.  She said she has had some small BMs but not much is coming out and it's very painful. Even difficult to pass gas.  Back and stomach is hurting.  She has tried suppositories, miralax and some kind of wax thing and nothing worked.  It made her stomach cramp and caused her to sweat.

## 2023-12-07 NOTE — ED Notes (Signed)
 Pt sitting up in bed, sig other at bedside, pt reports a slight decrease in pain

## 2023-12-07 NOTE — H&P (Signed)
 SERVICE: Gynecology unassigned [facility]    Patient Name: Allison Dawson Patient MRN:   [id number]   CC:1 week of worsen lower pelvic pain .    HPI: Allison Dawson is a 23 y.o. G1P1001 with bilateral lower pelvic pain + constipation , pressure .  Pt seen 2 weeks ago at [facility] and an u/s was ordered ( never done)      Review of Systems: positives in bold GEN:    fevers, chills, weight changes, appetite changes, fatigue, night sweats HEENT:  HA, vision changes, hearing loss, congestion, rhinorrhea, sinus pressure, dysphagia CV:       CP, palpitations PULM:  SOB, cough GI:       abd pain, N/V/D/C GU:      dysuria, urgency, frequency, pelvic pain / pressure  MSK:   arthralgias, myalgias, back pain, swelling SKIN:   rashes, color changes, pallor NEURO:  numbness, weakness, tingling, seizures, dizziness, tremors PSYCH:  depression, anxiety, behavioral problems, confusion  HEME/LYMPH:  easy bruising or bleeding ENDO:  heat/cold intolerance   Past Obstetrical History: OB History       Gravida  1   Para  1   Term  1   Preterm  0   AB  0   Living  1        SAB  0   IAB  0   Ectopic  0   Multiple  0   Live Births  1               Past Gynecologic History: No LMP recorded. 11/08/23   Past Medical History:     Past Medical History:  Diagnosis Date   Epilepsy (HCC)     Ovarian cyst     Seizures (HCC)     Strep pharyngitis            Past Surgical History:        Past Surgical History:  Procedure Laterality Date   CESAREAN SECTION N/A 02/28/2017    Procedure: CESAREAN SECTION;  Surgeon: Lorence Ozell CROME, MD;  Location: [facility] BIRTHING SUITES;  Service: Obstetrics;  Laterality: N/A;   WISDOM TOOTH EXTRACTION              Family History:  family history includes Diabetes in her maternal grandfather and another family member.   Social History:  Social History         Socioeconomic History   Marital status: Single      Spouse name: Not on file    Number of children: 1   Years of education: 10   Highest education level: Not on file  Occupational History   Occupation: stay at home mom   Tobacco Use   Smoking status: Former      Current packs/day: 0.00      Types: Cigarettes      Quit date: 05/27/2016      Years since quitting: 7.5   Smokeless tobacco: Never  Vaping Use   Vaping status: Never Used  Substance and Sexual Activity   Alcohol use: No   Drug use: No   Sexual activity: Not Currently      Birth control/protection: None  Other Topics Concern   Not on file  Social History Narrative    Lives at home with mom & newborn baby boy (born on 02/28/17)    Right handed    No caffeine use    Social Drivers of Scientist, physiological  Resource Strain: Low Risk  (10/22/2022)    Received from [facility]    Overall Financial Resource Strain (CARDIA)     Difficulty of Paying Living Expenses: Not very hard  Food Insecurity: No Food Insecurity (10/22/2022)    Received from [facility]    Hunger Vital Sign     Within the past 12 months, you worried that your food would run out before you got the money to buy more.: Never true     Within the past 12 months, the food you bought just didn't last and you didn't have money to get more.: Never true  Transportation Needs: No Transportation Needs (10/22/2022)    Received from [facility] - Transportation     Lack of Transportation (Medical): No     Lack of Transportation (Non-Medical): No  Physical Activity: Insufficiently Active (10/22/2022)    Received from [facility]    Exercise Vital Sign     On average, how many days per week do you engage in moderate to strenuous exercise (like a brisk walk)?: 2 days     On average, how many minutes do you engage in exercise at this level?: 30 min  Stress: Stress Concern Present (10/22/2022)    Received from [facility] - Occupational Stress Questionnaire     Feeling of Stress :  Rather much  Social Connections: Socially Integrated (10/22/2022)    Received from [facility]    Social Network     How would you rate your social network (family, work, friends)?: Good participation with social networks  Intimate Partner Violence: Not At Risk (10/22/2022)    Received from [facility]    HITS     Over the last 12 months how often did your partner physically hurt you?: Never     Over the last 12 months how often did your partner insult you or talk down to you?: Never     Over the last 12 months how often did your partner threaten you with physical harm?: Never     Over the last 12 months how often did your partner scream or curse at you?: Never     Home Medications:  Medications reconciled in EPIC  Medications Ordered Prior to Encounter  No current facility-administered medications on file prior to encounter.          Current Outpatient Medications on File Prior to Encounter  Medication Sig Dispense Refill   acetaminophen  (TYLENOL ) 325 MG tablet Take 650 mg by mouth every 6 (six) hours as needed for moderate pain or headache.       calcium carbonate (TUMS - DOSED IN MG ELEMENTAL CALCIUM) 500 MG chewable tablet Chew 2 tablets by mouth 3 (three) times daily as needed for indigestion or heartburn.       albuterol  (PROVENTIL  HFA;VENTOLIN  HFA) 108 (90 Base) MCG/ACT inhaler Inhale 1 puff into the lungs every 6 (six) hours as needed for wheezing or shortness of breath. (Patient not taking: Reported on 12/07/2023)       levETIRAcetam  (KEPPRA ) 750 MG tablet Take 1 tablet (750 mg total) by mouth 2 (two) times daily. 180 tablet 1   ondansetron  (ZOFRAN  ODT) 8 MG disintegrating tablet Take 1 tablet (8 mg total) by mouth every 8 (eight) hours as needed for nausea or vomiting. (Patient not taking: Reported on 12/07/2023) 10 tablet 0   Prenat-FeAsp-Meth-FA-DHA w/o A (PRENATE PIXIE ) 10-0.6-0.4-200 MG CAPS Take 1 tablet by  mouth daily. (Patient not taking: Reported on 12/07/2023) 30 capsule  12        Allergies:  Allergies       Allergies  Allergen Reactions   Diflucan  [Fluconazole ] Anaphylaxis and Itching      Reports burning of skin all over   Phenazopyridine Other (See Comments) and Rash      Patient states,  It makes my body burn real bad. Patient states,  It makes my body burn real bad. Patient states,  It makes my body burn real bad. Patient states,  It makes my body burn real bad. Patient states,  It makes my body burn real bad.         Physical Exam:  Temp:  [98.9 F (37.2 C)-99 F (37.2 C)] 99 F (37.2 C) (08/30 0856) Pulse Rate:  [80-92] 80 (08/30 0856) Resp:  [17-18] 17 (08/30 0856) BP: (109-123)/(71-89) 123/87 (08/30 0856) SpO2:  [99 %-100 %] 99 % (08/30 0856) Weight:  [54.4 kg] 54.4 kg (08/30 0443)     General Appearance:  Well developed, well nourished, no acute distress, alert and oriented x3 HEENT:  Normocephalic atraumatic, extraocular movements intact, moist mucous membranes Cardiovascular:  Normal S1/S2, regular rate and rhythm, no murmurs Pulmonary:  clear to auscultation, no wheezes, rales or rhonchi, symmetric air entry, good air exchange Abdomen:  Bowel sounds present, soft, ++ TTP , nondistended, no abnormal masses, no epigastric pain Extremities:  Full range of motion, no pedal edema, 2+ distal pulses, no tenderness Skin:  normal coloration and turgor, no rashes, no suspicious skin lesions noted  Neurologic:  Cranial nerves 2-12 grossly intact, normal muscle tone, strength 5/5 all four extremities Psychiatric:  Normal mood and affect, appropriate, no AH/VH Pelvic: deferred   Labs/Studies:    CBC and Coags:        Recent Labs       Lab Results  Component Value Date    WBC 10.8 (H) 12/07/2023    NEUTOPHILPCT 38 12/07/2023    EOSPCT 0 12/07/2023    BASOPCT 1 12/07/2023    LYMPHOPCT 54 12/07/2023    HGB 10.9 (L) 12/07/2023    HCT 34.1 (L) 12/07/2023    MCV 84.2 12/07/2023    PLT 276 12/07/2023      CMP:     Recent Labs       Lab Results  Component Value Date    NA 138 12/07/2023    K 3.9 12/07/2023    CL 108 12/07/2023    CO2 22 12/07/2023    BUN 11 12/07/2023    CREATININE 0.73 12/07/2023    CREATININE 0.66 01/09/2022    CREATININE 0.53 01/19/2017    PROT 7.2 12/07/2023    BILITOT 0.6 12/07/2023    ALT 158 (H) 12/07/2023    AST 173 (H) 12/07/2023    ALKPHOS 81 12/07/2023        Other Imaging:  Imaging Results  US  PELVIC DOPPLER (TORSION R/O OR MASS ARTERIAL FLOW) Result Date: 12/07/2023 CLINICAL DATA:  23 year old female with pelvic pain for 1 day. Septated and cystic 11 cm pelvic mass on CT. EXAM: TRANSABDOMINAL AND TRANSVAGINAL ULTRASOUND OF PELVIS DOPPLER ULTRASOUND OF OVARIES TECHNIQUE: Both transabdominal and transvaginal ultrasound examinations of the pelvis were performed. Transabdominal technique was performed for global imaging of the pelvis including uterus, ovaries, adnexal regions, and pelvic cul-de-sac. It was necessary to proceed with endovaginal exam following the transabdominal exam to visualize the ovaries. Color and duplex Doppler ultrasound was utilized to evaluate  blood flow to the ovaries. COMPARISON:  Noncontrast CT Abdomen and Pelvis 0609 hours today. FINDINGS: Uterus Measurements: 6.0 x 3.8 x 5.9 cm = volume: 70 mL. No fibroids or other mass visualized. Endometrium Thickness: 4 mm.  No focal abnormality visualized. Right ovary Measurements: 6.6 x 3.3 x 6.1 cm = volume: 70 mL. Partially septated 5 cm hypoechoic right ovarian cyst or adjacent cysts with vascular internal septation or cyst wall (image 93), low level echoes throughout the lesion(s). Superimposed large anterior pelvic similar cystic mass with homogeneous low level echoes, corresponding to the dominant CT lesion, 11 cm long axis (image 102). Much of this lacks internal vascular elements (images 97 and 101). However, transabdominal images do demonstrate vascular internal septation or lesion wall on image 55  series 1. Left ovary Measurements: Difficult to delineate, there appears to be some normal ovarian parenchyma inseparable from the dominant anterior mass (see cine series 2). And Doppler flow is detected within that area. Pulsed Doppler evaluation of both ovaries demonstrates normal low-resistance arterial and venous waveforms. Other findings No abnormal free fluid. IMPRESSION: 1. Complex bilateral ovarian cysts or cystic masses consisting mostly of homogeneous low level internal echoes, but with superimposed internal vascular septations versus adjacent lesions with vascular cyst wall. Favor multiple Endometriomas (up to 11 cm and dominant lesion likely arising from the left ovary), however, difficult to exclude ovarian neoplasm. Recommend GYN Surgery consultation. 2. No evidence of ovarian torsion.  Negative uterus. Electronically Signed   By: VEAR Hurst M.D.   On: 12/07/2023 08:19    CT ABDOMEN PELVIS WO CONTRAST Result Date: 12/07/2023 EXAM: CT ABDOMEN AND PELVIS WITHOUT CONTRAST 12/07/2023 06:12:48 AM TECHNIQUE: CT of the abdomen and pelvis was performed without the administration of intravenous contrast. Multiplanar reformatted images are provided for review. Automated exposure control, iterative reconstruction, and/or weight-based adjustment of the mA/kV was utilized to reduce the radiation dose to as low as reasonably achievable. COMPARISON: 10/09/2015 CLINICAL HISTORY: Abdominal pain, acute, nonlocalized; Abdominal/flank pain, stone suspected; Bowel obstruction suspected. Table formatting from the original note was not included.; Patient arrived by [facility] with complaints of constipation for over a week. States she has IBS and this happens sometimes but never caused pain and lasted this long. She said she has had some small BMs but not much is coming out and it's very painful. Even ; difficult to pass gas. Back and stomach is hurting.; ; She has tried suppositories, miralax and some kind of wax thing and nothing  worked. It made her stomach cramp and caused her to sweat. FINDINGS: LOWER CHEST: Similar appearance of small bilateral lower lobe lung nodules. No largest is in the periphery of the right lower lobe measuring 4 mm. These findings most likely reflect sequelae of remote inflammation or infection. No acute findings within the imaged portions of the lung bases. LIVER: Normal size and contour. GALLBLADDER AND BILE DUCTS: No wall thickening. No cholelithiasis. No biliary ductal dilatation. SPLEEN: Normal size. No focal lesion. PANCREAS: No mass. No ductal dilatation. ADRENAL GLANDS: Normal appearance. No mass. KIDNEYS, URETERS AND BLADDER: No stones in the kidneys or ureters. No hydronephrosis. No perinephric or periureteral stranding. Urinary bladder is unremarkable. GI AND BOWEL: The appendix is suboptimally visualized. No secondary signs of acute appendicitis. No pathologic dilatation of the large or small bowel loops. No signs of bowel inflammation. PERITONEUM AND RETROPERITONEUM: No signs of pneumoperitoneum. No significant free fluid or fluid collections. VASCULATURE: Aorta is normal in caliber. LYMPH NODES: No lymphadenopathy. REPRODUCTIVE ORGANS: Pelvic  organs are suboptimally visualized due to lack of contrast material. There is a septated cystic mass within the anterior pelvis which measures 11.1 x 7.4 cm, image 63/2. Incompletely characterized without IV contrast. BONES AND SOFT TISSUES: No acute osseous abnormality. No focal soft tissue abnormality. IMPRESSION: 1. Septated cystic mass within the anterior pelvis measuring 11.1 x 7.4 cm, incompletely characterized without IV contrast. Favor ovarian origin. Recommend further characterization with pelvic sonogram. Electronically signed by: Waddell Calk MD 12/07/2023 06:28 AM EDT RP Workstation: [id number]         Assessment / Plan:    Allison Dawson is a 23 y.o. G1P1001 who presents with subacute pelvic pain , worsening . U/S shows bilateral ovarian cyst c/w  endometriomas Strong FHX of endometriosis 1. Ovarian endometriomas  Laparoscopic ovarian cystectomies . I have counseled the patient for risks of surgery .Possible organ injury , possible blood transfusion , possible left oophorectomy  60 minutes in pt care    Thank you for the opportunity to be involved with this pt's care.       Electronically signed by Lovetta Debby PARAS, MD at 12/07/2023 11:11 AM Electronically signed by Lovetta Debby PARAS, MD at 12/07/2023 11:17 AM

## 2023-12-07 NOTE — Anesthesia Postprocedure Evaluation (Signed)
 Anesthesia Post Note  Patient: Ivionna Verley  Procedure(s) Performed: Laparoscopic left salpingo-ophorectomy  Extensive pelvic adhesiolysis Right ovarian cystotomy Cystoscopy (Bilateral)  Patient location during evaluation: PACU Anesthesia Type: General Level of consciousness: awake and alert Pain management: pain level controlled Vital Signs Assessment: post-procedure vital signs reviewed and stable Respiratory status: spontaneous breathing, nonlabored ventilation, respiratory function stable and patient connected to nasal cannula oxygen Cardiovascular status: blood pressure returned to baseline and stable Postop Assessment: no apparent nausea or vomiting Anesthetic complications: no   No notable events documented.   Last Vitals:  Vitals:   12/07/23 1759 12/07/23 1800  BP:  109/70  Pulse:    Resp: 15   Temp: 36.5 C   SpO2:      Last Pain:  Vitals:   12/07/23 1759  TempSrc: Tympanic  PainSc: 6                  Debby Mines

## 2023-12-07 NOTE — ED Provider Notes (Signed)
-----------------------------------------   7:53 AM on 12/07/2023 -----------------------------------------  Rounded on patient.  Currently patient not in room, await return from ultrasound.     Dicky Anes, MD 12/07/23 [phone]

## 2023-12-08 NOTE — Op Note (Unsigned)
 NAME: Allison Dawson, Allison Dawson MEDICAL RECORD NO: [id number] ACCOUNT NO: [id number] DATE OF BIRTH: [birth year] FACILITY: [facility] LOCATION: [facility] PHYSICIAN: Debby DOROTHA Dinsmore, MD  Operative Report   DATE OF PROCEDURE: 12/07/2023  PREOPERATIVE DIAGNOSES: 1. Acute onset of pelvic pain. 2. Bilateral ovarian cysts, left ovary 11 cm, right ovary 6 cm, both cysts consistent with endometriomas. 3. Rectal pressure.  POSTOPERATIVE DIAGNOSES: 1. Stage IV endometriosis with bilateral endometriomas. 2. Extensive pelvic adhesive disease.  PROCEDURE: 1. Laparoscopic left salpingo-oophorectomy. 2. Extensive pelvic adhesiolysis incorporating greater than 70% of total operating time. 3. Right ovarian cystotomy. 4. Cystoscopy.  SURGEON: Debby DOROTHA Dinsmore, MD  CONSULTING PHYSICIAN:  Dr. Desiderio, General Surgery.  INDICATIONS:  A 23 year old gravida 1, para 1 patient with at least a 2-week history of increasing pelvic pain and rectal pressure presumed to be due to constipation. The patient was seen by a family practice clinic approximately 1 week ago and an  ultrasound was ordered, which had not been done until today, the day that she has presented to the Emergency Department with acute onset of pain. Ultrasound and CT scan were performed. Ultrasound demonstrated bilateral ovarian cyst, homogeneous pattern,  with the left ovarian cyst of 11 cm and the right ovarian cyst of 6 cm. Both the cysts were consistent with endometriomas. The patient has a strong family history of endometriosis.  DESCRIPTION OF PROCEDURE: After adequate general endotracheal anesthesia, the patient's abdomen, perineum, and vagina were prepped and draped in sterile fashion. A single-tooth tenaculum was placed on the anterior lip of the cervix and the Cohen cannula  was placed into the endocervical canal to be used for uterine manipulation during the procedure. A Foley catheter was placed in the bladder for the procedure. Gloves and  gowns were changed. Attention was directed to the patient's abdomen where a 5 mm  infraumbilical incision was made after injecting with 0.5% Marcaine . The 5-mm laparoscope was advanced into the abdominal cavity under direct visualization with the Optiview cannula. Initial impression revealed a 12-cm left ovarian cyst. Second port  placement was placed 3 cm medial to the left anterior iliac spine and under direct visualization, this port was placed. A 5-mm port was placed. Similar procedure was repeated on the patient's right pelvic side, again 3 cm medial to the right anterior  iliac spine, a 5-mm trocar was advanced under direct visualization. The left ovarian cyst was adhesed densely to the uterus, to the sidewall, and to the posterior aspect of the cyst and adhesed to the sigmoid colon. Harmonic scalpel was brought up and  the ovarian cyst was opened and 300 mL of dark chocolate fluid cyst consistent with the endometrioma was evacuated. The ovary was then placed on traction to dissect some of the initial pelvic adhesions. Over the next hour and a half, significant pelvic  adhesiolysis was performed with me and consulting general surgeon, Dr. Desiderio, who was scrubbed in for the case given the amount of bowel adhesions to the posterior aspect of the uterus and to the ovary. See Dr. Almarie note.a fourth 5 mm was place in the LLQ. Ultimately, the majority  of the sigmoid bowel adhesion was removed from the posterior uterus and all of it had been removed from the ovary. Given the amount of distortion and adhesions of the left ovary, it was elected at this time to do a left salpingo-oophorectomy.A #11 canula was then placed where the inferiorLLQ 5 mm was .[name] The left infundibulopelvic ligament was cauterized with the Ligature and harmonic scalpel. The  left fallopian tube and ovary were ultimately dissected free from the anterior aspect of the uterus.  Good hemostasis was noted at this time. The right ovary  was scarred into the right ovarian fossa. A harmonic scalpel was used to open the ovarian cyst and additional dark chocolate fluid extruded from the cyst. Copious irrigation was performed. The  appendix appeared normal. Surgicel was then placed over the denuded area of the bowel, the posterior uterus, and the left pelvic sidewall where the adhesions were taken down. Intraabdominal pressure was lowered to 7 mmHg. Good hemostasis was noted.  Pictures were taken and all ports were removed after deflating the patient's abdomen. Given the amount of dissection on the left sidewall, cystoscopy was performed at the end of the procedure after removing the Foley catheter and removing the  single-tooth tenaculum, the Cohen cannula. Silver  nitrite was applied to the tenaculum sites. A 30-degree scope was advanced into the bladder and ureteral orifices were identified after instilling 200 mL of normal saline into the bladder. A normal efflux  of urine was immediately seen on the patient's left side and the patient was then given 0.5 mL of methylene blue  and 0.5 mL of fluorescein  followed by 10 mg of Lasix  and ultimately the right ureteral orifice demonstrated efflux, although much attenuated  compared to the left side. Again, no significant dissection was performed on the patient's right lateral sidewall. The patient's bladder was then drained. The patient was given 30 mg of intravenous Toradol  at the end of the procedure.   The patient tolerated the procedure well and the patient's abdomen was re-inflated to 15 mmHg and the fourth port placement, which was performed during the procedure, a 10-mm trocar that was placed through the previous third inflated to 15 mmHg.  Of  note, a fourth 5-mm trocar was advanced during Dr. Almarie dissection. The left lower port site was replaced with an 11-mm port site so that the previously dissected left fallopian tube and ovary was then placed into an Endobag and then removed  through  this left lower 11-mm port site. The Carter-Thomason, PMI instruments were used to close the fascia of the left lower 11-mm port site. Two separate sutures were used. Ultimately, the patient's abdomen was deflated and all instruments were removed. Each  skin incision was closed with interrupted 4-0 Vicryl suture. A sterile dressing was applied. Given the amount of dissection on the left pelvic sidewall and to the inferior aspect of the uterus, cystoscopy was performed at the end of the procedure after  removal of the Foley catheter and removal of the single-tooth tenaculum and Cohen cannula. Cystoscopy was performed instilling 200 mL of normal saline. Immediate efflux of urine from the left ureter was identified. Delayed efflux was seen from the right  ureter after instilling intravenous methylene blue  and 0.5 mL of fluorescein . Again, no significant dissection was performed on the patient's right ovarian fossa or right sidewall. The patient's bladder was then re-drained.  ESTIMATED BLOOD LOSS: 50 mL  INTRAOPERATIVE FLUIDS: 1700 mL  URINE OUTPUT: 200 mL  DISPOSITION:  The patient was taken to the recovery room in good condition.      PAA D: 12/07/2023 5:34:32 pm T: 12/08/2023 2:11:00 am  JOB: [id number]/ [id number]

## 2023-12-10 ENCOUNTER — Encounter: Payer: Self-pay | Admitting: Obstetrics and Gynecology

## 2023-12-11 LAB — SURGICAL PATHOLOGY
